# Patient Record
Sex: Male | Born: 1950 | Race: White | Hispanic: No | Marital: Married | Smoking: Former smoker
Health system: Southern US, Community
[De-identification: ages and names within clinical notes are randomized; demographics above are authoritative.]

## PROBLEM LIST (undated history)

## (undated) DIAGNOSIS — E119 Type 2 diabetes mellitus without complications: Secondary | ICD-10-CM

## (undated) DIAGNOSIS — J449 Chronic obstructive pulmonary disease, unspecified: Secondary | ICD-10-CM

## (undated) DIAGNOSIS — I10 Essential (primary) hypertension: Secondary | ICD-10-CM

## (undated) DIAGNOSIS — E7800 Pure hypercholesterolemia, unspecified: Secondary | ICD-10-CM

---

## 1998-09-30 ENCOUNTER — Ambulatory Visit: Admission: RE | Admit: 1998-09-30 | Payer: Self-pay | Source: Ambulatory Visit | Admitting: Urology

## 2003-06-26 ENCOUNTER — Emergency Department: Admit: 2003-06-26 | Payer: Self-pay | Source: Emergency Department | Admitting: Emergency Medicine

## 2005-03-27 DIAGNOSIS — J449 Chronic obstructive pulmonary disease, unspecified: Secondary | ICD-10-CM | POA: Diagnosis present

## 2005-03-27 DIAGNOSIS — G4733 Obstructive sleep apnea (adult) (pediatric): Secondary | ICD-10-CM | POA: Diagnosis present

## 2005-07-04 DIAGNOSIS — H905 Unspecified sensorineural hearing loss: Secondary | ICD-10-CM | POA: Diagnosis present

## 2008-09-19 ENCOUNTER — Emergency Department: Payer: Self-pay | Admitting: Emergency Medicine

## 2010-02-22 ENCOUNTER — Emergency Department: Payer: Self-pay | Admitting: Internal Medicine

## 2011-07-22 IMAGING — CT CT HEAD WITHOUT CONTRAST
2 series · 16 of 30 positions shown, 20 images · non-contrast
Comparison: none

REASON FOR EXAM: facial weakness
COMMENTS:

PROCEDURE:     CT  - CT HEAD WITHOUT CONTRAST  - February 22, 2010  [DATE]
RESULT:     Technique: Helical 5mm sections were obtained from the skull
base to the vertex without administration of intravenous contrast.

[Series 2: without · axial · non-contrast · 0.49mm/px · z∈[-173,-48]mm · 13 of 31 slices shown, 17 images]
[im 3/31  brain]
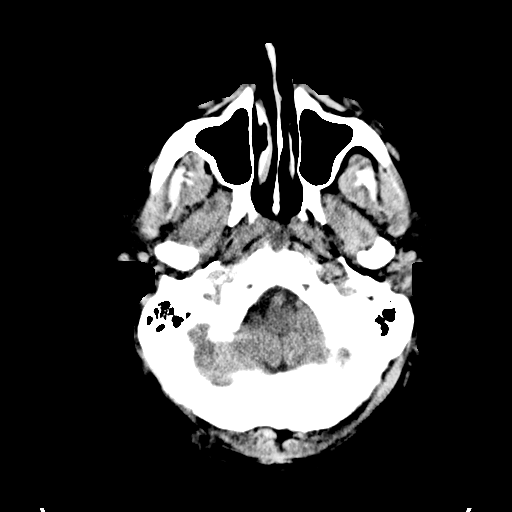
[im 3/31  bone]
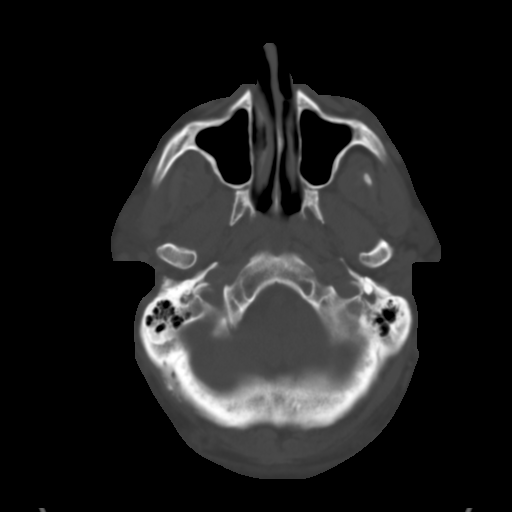
[im 5/31  brain]
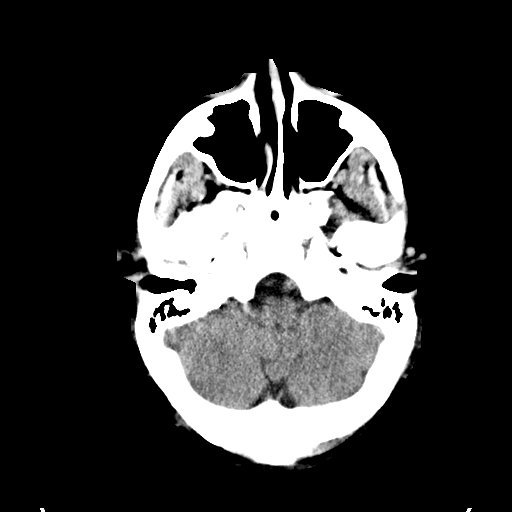
[im 7/31  brain]
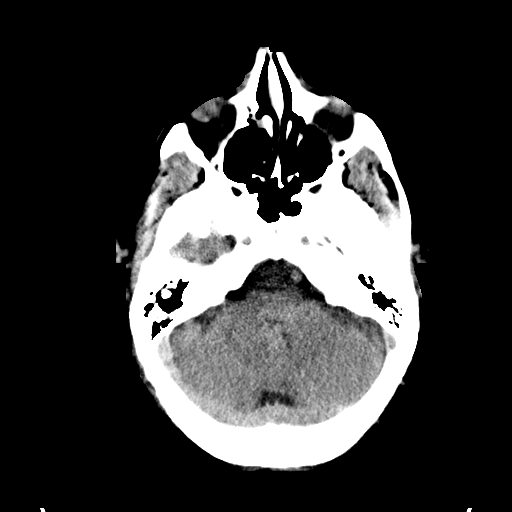
[im 9/31  brain]
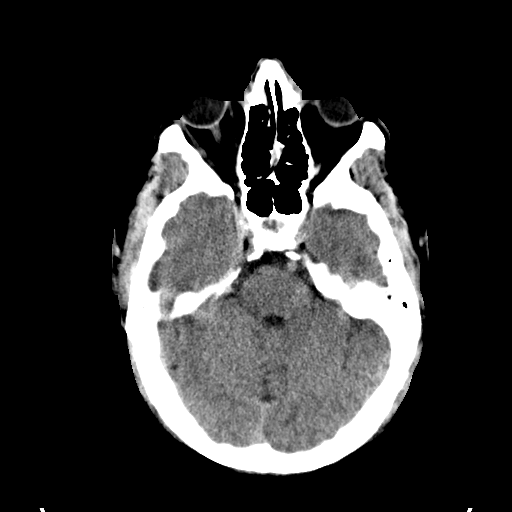
[im 11/31  brain]
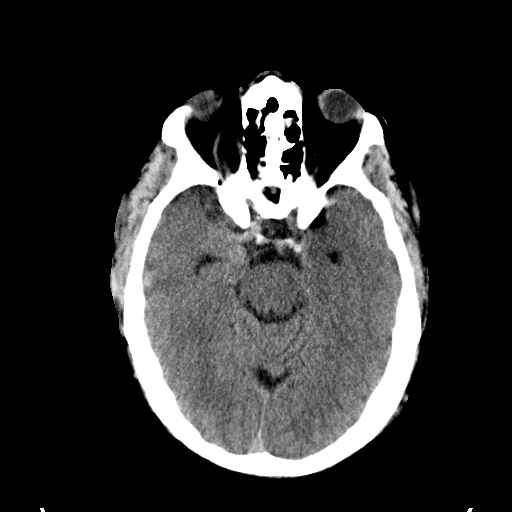
[im 11/31  bone]
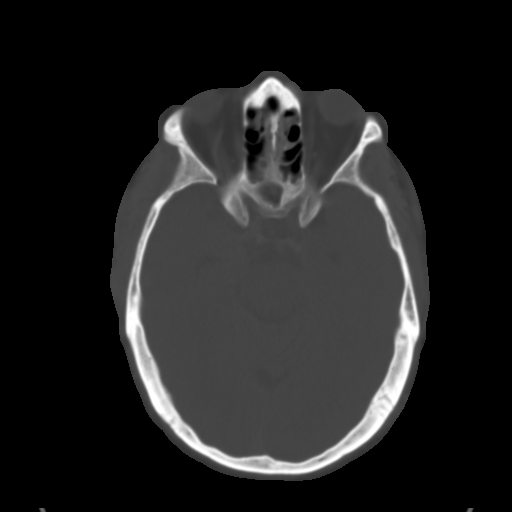
[im 13/31  brain]
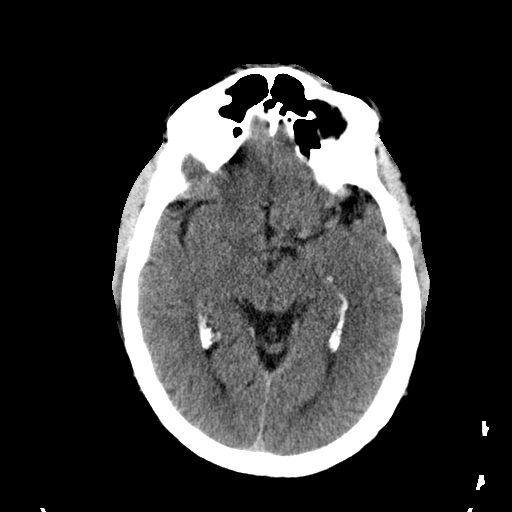
[im 16/31  brain]
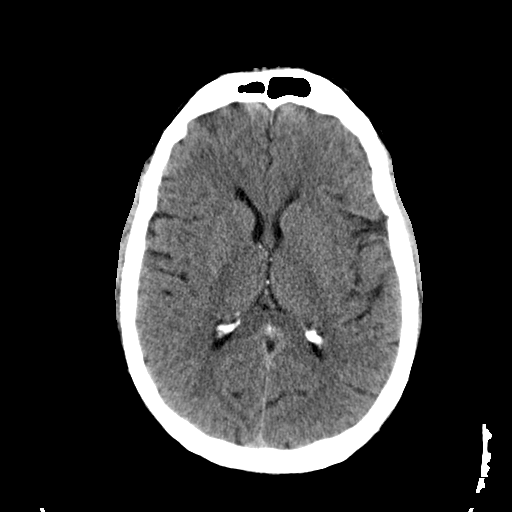
[im 18/31  brain]
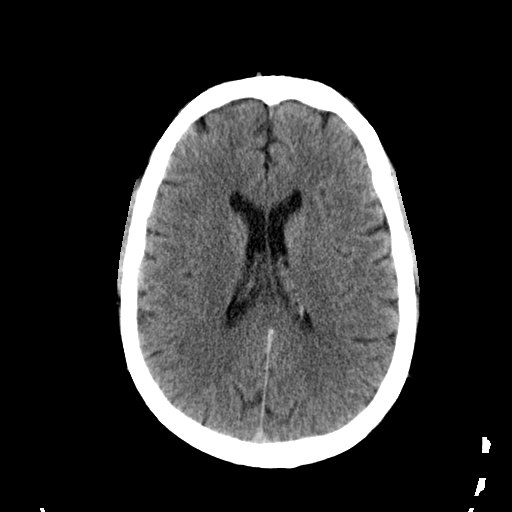
[im 20/31  brain]
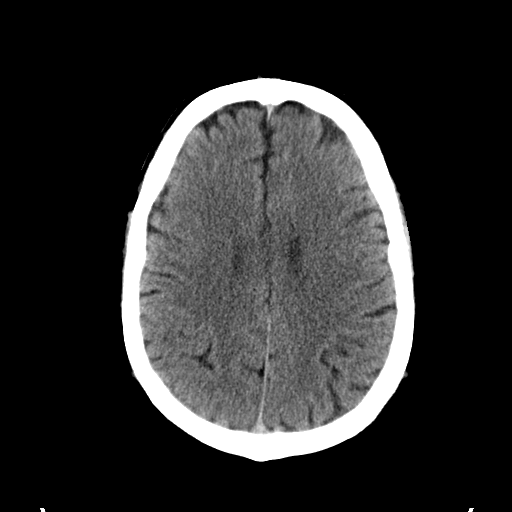
[im 20/31  bone]
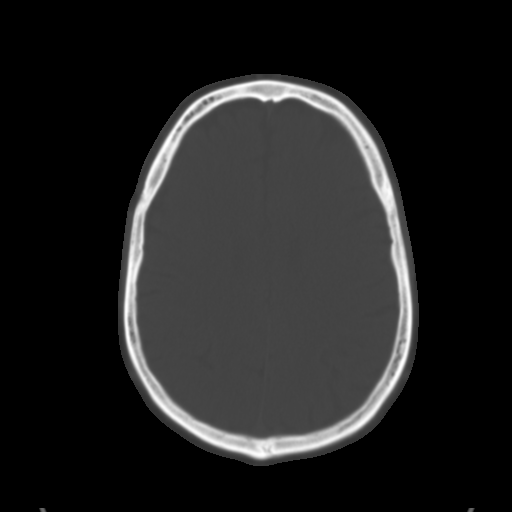
[im 22/31  brain]
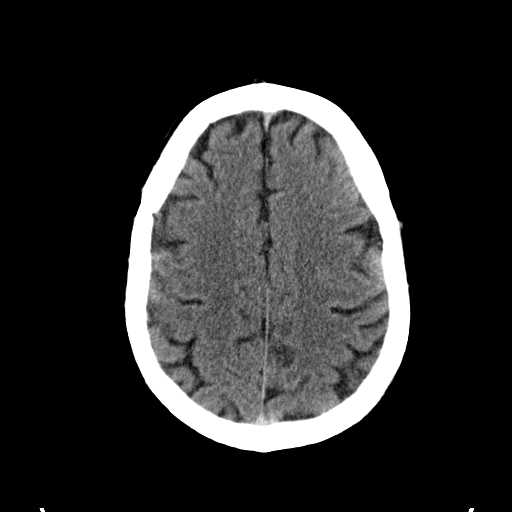
[im 24/31  brain]
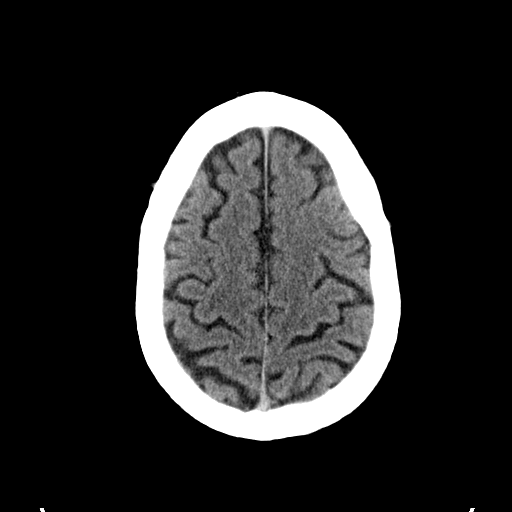
[im 26/31  brain]
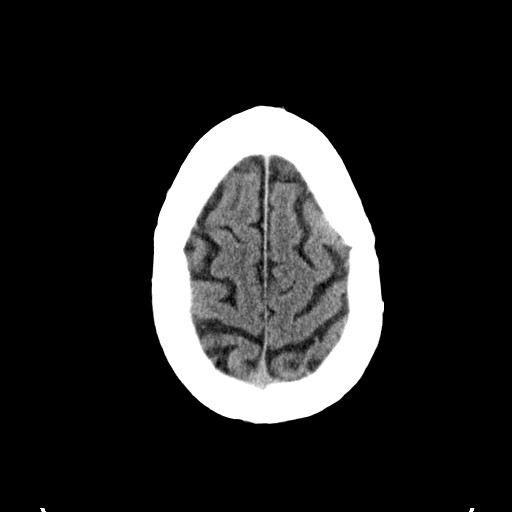
[im 28/31  brain]
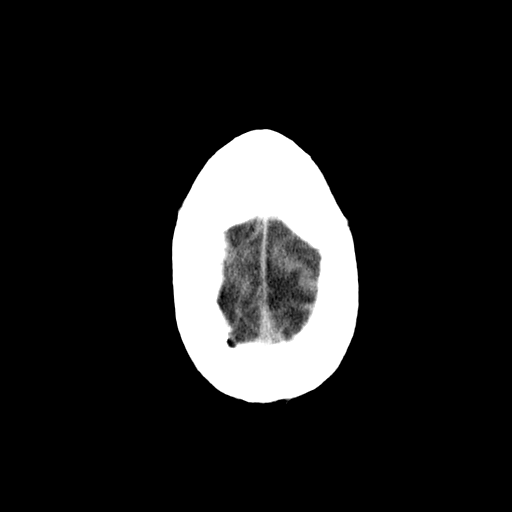
[im 28/31  bone]
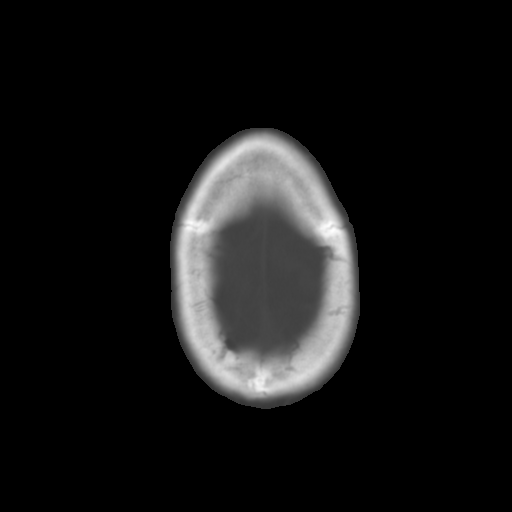

[Series 3: bone · axial · 0.49mm/px · z∈[-173,-133]mm · 3 of 31 slices shown]
[im 3/31  bone]
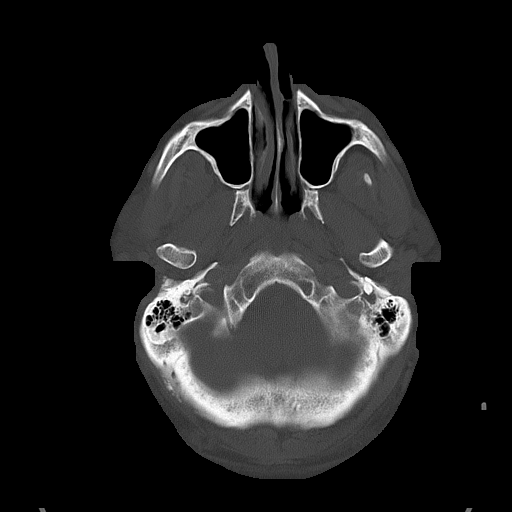
[im 7/31  bone]
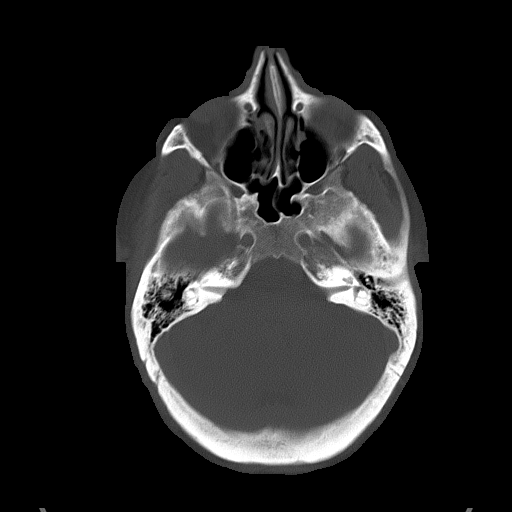
[im 11/31  bone]
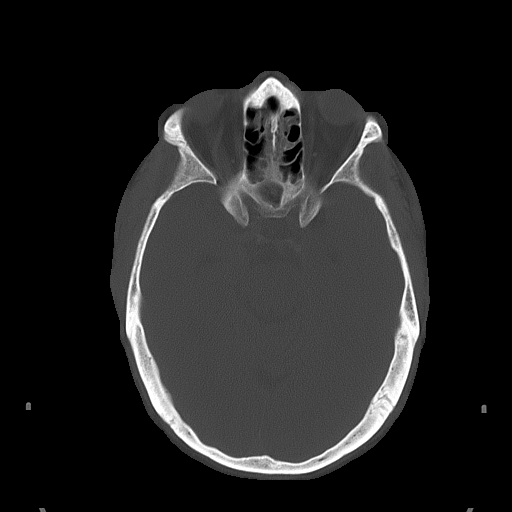

[16 of 30 positions shown; findings below may reference images not displayed]

FINDINGS: There is not evidence of intra-axial fluid collections. There is
no evidence of acute hemorrhage or secondary signs reflecting mass effect or
subacute or chronic focal territorial infarction. The osseous structures
demonstrate no evidence of a depressed skull fracture. If there is
persistent concern clinical follow-up with MRI is recommended.
IMPRESSION: 1. No evidence of acute intracranial abnormalitites.

## 2015-08-06 DIAGNOSIS — J9611 Chronic respiratory failure with hypoxia: Secondary | ICD-10-CM | POA: Insufficient documentation

## 2016-10-04 ENCOUNTER — Emergency Department
Admission: EM | Admit: 2016-10-04 | Discharge: 2016-10-04 | Disposition: A | Discharge status: Other Acute Inpatient Hospital | Payer: PRIVATE HEALTH INSURANCE | Attending: Emergency Medicine | Admitting: Emergency Medicine

## 2016-10-04 ENCOUNTER — Emergency Department: Payer: PRIVATE HEALTH INSURANCE

## 2016-10-04 DIAGNOSIS — J189 Pneumonia, unspecified organism: Secondary | ICD-10-CM | POA: Insufficient documentation

## 2016-10-04 HISTORY — DX: Type 2 diabetes mellitus without complications: E11.9

## 2016-10-04 HISTORY — DX: Essential (primary) hypertension: I10

## 2016-10-04 HISTORY — DX: Pure hypercholesterolemia, unspecified: E78.00

## 2016-10-04 HISTORY — DX: Chronic obstructive pulmonary disease, unspecified: J44.9

## 2016-10-04 LAB — CBC AND DIFFERENTIAL
Absolute NRBC: 0 10*3/uL
Basophils Absolute Automated: 0.04 10*3/uL (ref 0.00–0.20)
Basophils Automated: 0.4 %
Eosinophils Absolute Automated: 0.08 10*3/uL (ref 0.00–0.70)
Eosinophils Automated: 0.8 %
Hematocrit: 38.4 % — ABNORMAL LOW (ref 42.0–52.0)
Hgb: 11.6 g/dL — ABNORMAL LOW (ref 13.0–17.0)
Immature Granulocytes Absolute: 0.04 10*3/uL
Immature Granulocytes: 0.4 %
Lymphocytes Absolute Automated: 1.43 10*3/uL (ref 0.50–4.40)
Lymphocytes Automated: 14.4 %
MCH: 25.2 pg — ABNORMAL LOW (ref 28.0–32.0)
MCHC: 30.2 g/dL — ABNORMAL LOW (ref 32.0–36.0)
MCV: 83.5 fL (ref 80.0–100.0)
MPV: 10.5 fL (ref 9.4–12.3)
Monocytes Absolute Automated: 1.1 10*3/uL (ref 0.00–1.20)
Monocytes: 11.1 %
Neutrophils Absolute: 7.22 10*3/uL (ref 1.80–8.10)
Neutrophils: 72.9 %
Nucleated RBC: 0 /100 WBC (ref 0.0–1.0)
Platelets: 313 10*3/uL (ref 140–400)
RBC: 4.6 10*6/uL — ABNORMAL LOW (ref 4.70–6.00)
RDW: 17 % — ABNORMAL HIGH (ref 12–15)
WBC: 9.91 10*3/uL (ref 3.50–10.80)

## 2016-10-04 LAB — I-STAT CHEM 8 CARTRIDGE
BUN I-Stat: 15 mg/dL (ref 8–20)
Chloride I-Stat: 95 mEq/L — ABNORMAL LOW (ref 98–107)
Creatinine I-Stat: 1.5 mg/dL (ref 0.6–1.5)
Glucose I-Stat: 121 mg/dL — ABNORMAL HIGH (ref 70–100)
Hematocrit I-Stat: 57 % — ABNORMAL HIGH (ref 42.0–52.0)
Hemoglobin I-Stat: 19.4 g/dL — ABNORMAL HIGH (ref 13.0–17.0)
Potassium I-Stat: 2.9 mEq/L — ABNORMAL LOW (ref 3.5–5.3)
Sodium I-Stat: 140 mEq/L (ref 136–146)
i-STAT CO2: 32 mEq/L — ABNORMAL HIGH (ref 21–30)
i-STAT Calcium Ionized: 2.3 mEq/L (ref 2.30–2.58)

## 2016-10-04 LAB — I-STAT CG4 VENOUS CARTRIDGE
Lactic Acid I-Stat: 0.9 mmol/L (ref 0.2–2.0)
i-STAT Base Excess Venous: 7 mEq/L
i-STAT FIO2: 21
i-STAT HCO3 Bicarbonate Venous: 33.2 mEq/L
i-STAT Liters Per Minute: 4
i-STAT O2 Saturation Venous: 49 %
i-STAT Patient Temperature: 98.9
i-STAT Total CO2 Venous: 35 mEq/L
i-STAT pCO2 Venous: 54.2
i-STAT pH Venous: 7.396
i-STAT pO2 Venous: 28

## 2016-10-04 MED ORDER — POTASSIUM CHLORIDE CRYS ER 20 MEQ PO TBCR
20.0000 meq | EXTENDED_RELEASE_TABLET | Freq: Once | ORAL | Status: AC
Start: 2016-10-04 — End: 2016-10-04
  Administered 2016-10-04: 20 meq via ORAL
  Filled 2016-10-04: qty 1

## 2016-10-04 MED ORDER — ALBUTEROL SULFATE (2.5 MG/3ML) 0.083% IN NEBU
2.5000 mg | INHALATION_SOLUTION | Freq: Once | RESPIRATORY_TRACT | Status: AC
Start: 2016-10-04 — End: 2016-10-04
  Administered 2016-10-04: 2.5 mg via RESPIRATORY_TRACT
  Filled 2016-10-04: qty 3

## 2016-10-04 MED ORDER — SODIUM CHLORIDE 0.9 % IV BOLUS
500.0000 mL | Freq: Once | INTRAVENOUS | Status: AC
Start: 2016-10-04 — End: 2016-10-04
  Administered 2016-10-04: 500 mL via INTRAVENOUS

## 2016-10-04 MED ORDER — IPRATROPIUM BROMIDE 0.02 % IN SOLN
0.5000 mg | Freq: Once | RESPIRATORY_TRACT | Status: AC
Start: 2016-10-04 — End: 2016-10-04
  Administered 2016-10-04: 0.5 mg via RESPIRATORY_TRACT
  Filled 2016-10-04: qty 2.5

## 2016-10-04 MED ORDER — DOXYCYCLINE 100 MG MBP (CNR)
100.0000 mg | Freq: Once | INTRAVENOUS | Status: AC
Start: 2016-10-04 — End: 2016-10-04
  Administered 2016-10-04: 100 mg via INTRAVENOUS
  Filled 2016-10-04: qty 100

## 2016-10-04 NOTE — ED Notes (Signed)
Bed: S 22  Expected date:   Expected time:   Means of arrival:   Comments:  Medic 423

## 2016-10-04 NOTE — Discharge Instructions (Signed)
Dear Ralph Barron:    Thank you for choosing the [facility] Emergency Department, the premier emergency department in the [place] area.  I hope your visit today was EXCELLENT.    Specific instructions for your visit today:    Pneumonia    You have been diagnosed with pneumonia.    Pneumonia is an infection of the small air tubes and sacs of the lungs. It can be caused by a virus, bacteria or fungus.     Symptoms include fever (temperature higher than 100.50F / 38C), shortness of breath and a cough that produces a green or yellow material. You may have chest pain, vomiting, or headache.    Some people with pneumonia must stay in the hospital. Patients with certain risk factors, like diabetes, cancer, alcoholism, or another chronic medical condition, can be difficult to treat. The physician has determined that it is OK for you to go home.    Follow up closely with your doctor.    Do not smoke. Smoking may cause your pneumonia symptoms to become worse. Smoking also causes heart disease, cancer, and birth defects. If you smoke, ask your doctor for ideas about how to quit.   If you do not smoke, avoid others who do. Smoke will irritate your lungs and make your breathing worse while you have pneumonia.    YOU SHOULD SEEK MEDICAL ATTENTION IMMEDIATELY, EITHER HERE OR AT THE NEAREST EMERGENCY DEPARTMENT, IF ANY OF THE FOLLOWING OCCURS:   You wheeze or have trouble breathing.   You have a fever (temperature higher than 100.50F / 38C) that won't go away.   You cough up blood.   You have chest pain.   You vomit and cannot keep medications down or you feel dizzy, weak or confused.   You feel worse or don't improve in 2 to 3 days.   You are unable to perform your normal activities.                 If you do not continue to improve or your condition worsens, please contact your doctor or return immediately to the Emergency Department.    Sincerely,  Estrella Myrtle, MD  Attending Emergency Physician  [facility] Emergency Department    ONSITE PHARMACY  Our full service onsite pharmacy is located in the ER waiting room.  Open 7 days a week from 9 am to 11 pm.  We accept all major insurances and prices are competitive with major retailers.  Ask your provider to print your prescriptions down to the pharmacy to speed you on your way home.    OBTAINING A PRIMARY CARE APPOINTMENT    Primary care physicians (PCPs, also known as primary care doctors) are either internists or family medicine doctors. Both types of PCPs focus on health promotion, disease prevention, patient education and counseling, and treatment of acute and chronic medical conditions.    Call for an appointment with a primary care doctor.  Ask to see who is taking new patients.     [facility]  telephone:  [phone]  [email or web address]    DOCTOR REFERRALS  Call [phone] (available 24 hours a day, 7 days a week) if you need any further referrals and we can help you find a primary care doctor or specialist.  Also, available online at:  [email or web address]    YOUR CONTACT INFORMATION  Before leaving please check with registration to make sure we have an up-to-date contact number.  You can call  registration at [phone] to update your information.  For questions about your hospital bill, please call [phone].  For questions about your Emergency Dept Physician bill please call [phone].      [place]  If you need help with health or social services, please call 2-1-1 for a free referral to resources in your area.  2-1-1 is a free service connecting people with information on health insurance, free clinics, pregnancy, mental health, dental care, food assistance, housing, and substance abuse counseling.  Also, available online at:  http://www.[place].org    MEDICAL RECORDS AND TESTS  Certain laboratory test results do not come back the same day, for example urine cultures.   We will  contact you if other important findings are noted.  Radiology films are often reviewed again to ensure accuracy.  If there is any discrepancy, we will notify you.      Please call [phone] to pick up a complimentary CD of any radiology studies performed.  If you or your doctor would like to request a copy of your medical records, please call [phone].      ORTHOPEDIC INJURY   Please know that significant injuries can exist even when an initial x-ray is read as normal or negative.  This can occur because some fractures (broken bones) are not initially visible on x-rays.  For this reason, close outpatient follow-up with your primary care doctor or bone specialist (orthopedist) is required.    MEDICATIONS AND FOLLOWUP  Please be aware that some prescription medications can cause drowsiness.  Use caution when driving or operating machinery.    The examination and treatment you have received in our Emergency Department is provided on an emergency basis, and is not intended to be a substitute for your primary care physician.  It is important that your doctor checks you again and that you report any new or remaining problems at that time.      [place]  The nearest 24 hour pharmacy is:    [facility] [address] [place] [address]  [place]  [facility])  Call to start or finish an application, compare plans, enroll or ask a question.  [place]: [phone]  Web:  Healthcare.gov    Help Enrolling in [place]  289-397-4230 (TOLL-FREE)  [phone] (TTY)  Web:  Http://www.coverva.org    Local Help Enrolling in the [place]  [phone] (MAIN)  Email:  health-help@nvfs .org  Web:  http://lewis-perez.info/  Address:  [address], [address] [place], [place] [address]    SEDATING MEDICATIONS  Sedating medications include strong pain medications (e.g. narcotics),  muscle relaxers, benzodiazepines (used for anxiety and as muscle relaxers), Benadryl/diphenhydramine and other antihistamines for allergic reactions/itching, and other medications.  If you are unsure if you have received a sedating medication, please ask your physician or nurse.  If you received a sedating medication: DO NOT drive a car. DO NOT operate machinery. DO NOT perform jobs where you need to be alert.  DO NOT drink alcoholic beverages while taking this medicine.     If you get dizzy, sit or lie down at the first signs. Be careful going up and down stairs.  Be extra careful to prevent falls.     Never give this medicine to others.     Keep this medicine out of reach of  children.     Do not take or save old medicines. Throw them away when outdated.     Keep all medicines in a cool, dry place. DO NOT keep them in your bathroom medicine cabinet or in a cabinet above the stove.    MEDICATION REFILLS  Please be aware that we cannot refill any prescriptions through the ER. If you need further treatment from what is provided at your ER visit, please follow up with your primary care doctor or your pain management specialist.    [place]  Did you know [facility] has two freestanding ERs located just a few miles away?  [facility] [place] have short wait times, easy free parking directly in front of the building and top patient satisfaction scores - and the same Board Certified Emergency Medicine doctors as [facility].

## 2016-10-04 NOTE — ED Provider Notes (Signed)
[place] [facility] EMERGENCY DEPARTMENT H&P      Visit date: 10/04/2016      CLINICAL SUMMARY          Diagnosis:    .     Final diagnoses:   Pneumonia of both lungs due to infectious organism, unspecified part of lung         MDM Notes:      66 year old male pmh copd, htn, dm, hld presents with history of cough and congestion since Sunday.  He currently on day 3 of his azithromycin pack.  Reports worsening cough and congestion this morning.  Cough is productive with brown sputum.  His last nebulizer treatment was last night around 11 PM.  He is diffusely wheezy on exam with poor aeration, and sats in the 90s.  We will give DuoNeb here and reassess.  Chest x-ray to assess for pneumonia.    New Pna despite abx and worsening sats, will obs admit for further nebs and med mgmt.          Disposition:         Observation Admit      ED Disposition     ED Disposition Condition Date/Time Comment    Transfer to [facility]  Wed Oct 04, 2016  9:24 AM Dr Hart [place] accepting.                      CLINICAL INFORMATION        HPI:      Chief Complaint: Shortness of Breath  .    Ralph Barron is a 66 y.o. male h/o COPD who presents with cough, shortness of breath.  Cough is productive with brown sputum.  A/w nasal congestion.  Pt was dx with bronchitis on Sunday (3 days ago), rx'd azithromycin; pt did not have a CXR at that time.  Since then, pt's sxs have been worsening in severity.  Pt has been using albuterol nebulizers, without relief.  Pt is on supplemental O2 at night, and sometimes also during the day; today, he noted his O2 saturation to be low.  Denies hx of blood clots.  Pt notes having loose stools recently, but denies vomiting or fever.     History obtained from: Patient        ROS:      Review of Systems   Constitutional: Negative for fever.   HENT: Positive for congestion.    Respiratory: Positive for cough and shortness of breath.    Gastrointestinal: Positive for diarrhea.  Negative for vomiting.           Physical Exam:      Pulse (!) 112  BP 153/84  Resp (!) 24  SpO2 91 %  Temp 98.9 F (37.2 C)    Physical Exam   Constitutional: He is oriented to person, place, and time. He appears well-developed and well-nourished. No distress.   HENT:   Head: Normocephalic and atraumatic.   Eyes: Conjunctivae are normal. Pupils are equal, round, and reactive to light.   Neck: Neck supple. No tracheal deviation present.   Cardiovascular: Normal rate, normal heart sounds and intact distal pulses.    No murmur heard.  Pulmonary/Chest:   Poor aeration.  Decreased breath sounds in bilateral bases.  Wheezes in all fields.   Abdominal: Soft. Bowel sounds are normal. He exhibits no distension and no mass. There is no tenderness.   Musculoskeletal: He exhibits no edema or tenderness.   Neurological: He  is alert and oriented to person, place, and time.   Skin: Skin is warm and dry. He is not diaphoretic.   Psychiatric: He has a normal mood and affect.   Nursing note and vitals reviewed.               PAST HISTORY        Primary Care Provider: Pearletha Forge, MD        PMH/PSH:    .     Past Medical History:   Diagnosis Date   . Chronic obstructive pulmonary disease    . Diabetes mellitus    . High cholesterol    . Hypertension        He has no past surgical history on file.      Social/Family History:      He reports that he has never smoked. He does not have any smokeless tobacco history on file. He reports that he does not drink alcohol or use drugs.    History reviewed. No pertinent family history.      Listed Medications on Arrival:    .     Home Medications     None on File         Allergies: He has No Known Allergies.            VISIT INFORMATION        Clinical Course in the ED:          ED Course as of Oct 04 1545   Wed Oct 04, 2016   6885 66 year old male paraspinal history presents with history of cough and congestion since Sunday.  He currently on day 3 of his azithromycin pack.  Reports  worsening cough and congestion this morning.  Cough is productive with brown sputum.  His last nebulizer treatment was last night around 11 PM.  He is diffusely wheezy on exam with poor aeration, and sats in the 90s.  We will give DuoNeb here and reassess.  Chest x-ray to assess for pneumonia.  [JB]   0809 Sats improved and better aeration, will change meds to doxy and if  [JB]   0904 Pt sats dropped to low 80s after ambulation with 2 L NC  [JB]   0913 Dr. Suezanne Jacquet from [facility] to call back with admission bed. Requests cbc and cr labs.   [JB]      ED Course User Index  [JB] Estrella Myrtle, MD         Medications Given in the ED:    .     ED Medication Orders     Start Ordered     Status Ordering Provider    10/04/16 1030 10/04/16 0913  doxycycline (VIBRAMYCIN) 100 mg in sodium chloride 0.9 % 100 mL mini-bag plus  Once     Route: Intravenous  Ordered Dose: 100 mg     Last MAR action:  Given Shamond Skelton L    10/04/16 0936 10/04/16 0936  potassium chloride (K-DUR,KLOR-CON) CR tablet 20 mEq  Once     Route: Oral  Ordered Dose: 20 mEq     Last MAR action:  Given German Manke L    10/04/16 0757 10/04/16 0756  sodium chloride 0.9 % bolus 500 mL  Once     Route: Intravenous  Ordered Dose: 500 mL     Last MAR action:  Stopped Ethel Veronica L    10/04/16 0744 10/04/16 0743  albuterol (PROVENTIL) nebulizer solution 2.5 mg  RT -  Once     Route: Nebulization  Ordered Dose: 2.5 mg     Last MAR action:  Given Nygeria Lager L    10/04/16 0656 10/04/16 0655  albuterol (PROVENTIL) nebulizer solution 2.5 mg  RT - Once     Route: Nebulization  Ordered Dose: 2.5 mg     Last MAR action:  Given Kaiven Vester L    10/04/16 0656 10/04/16 0655  ipratropium (ATROVENT) 0.02 % nebulizer solution 0.5 mg  RT - Once     Route: Nebulization  Ordered Dose: 0.5 mg     Last MAR action:  Given Ruffin Lada L            Procedures:      Procedures      Interpretations:      O2 sat-           saturation: 91 %; Oxygen use: room air;  Interpretation: Hypoxic       EKG -             interpreted by me: Sinus tachycardia, no stemi, RBBB  Rate: 106  PR 192  QRS 148  QTC 488                      RESULTS        Lab Results:      Results     Procedure Component Value Units Date/Time    CBC with differential [[id number]]  (Abnormal) Collected:  10/04/16 0931    Specimen:  Blood from Blood Updated:  10/04/16 1019     WBC 9.91 x10 3/uL      Hgb 11.6 (L) g/dL      Hematocrit 60.1 (L) %      Platelets 313 x10 3/uL      RBC 4.60 (L) x10 6/uL      MCV 83.5 fL      MCH 25.2 (L) pg      MCHC 30.2 (L) g/dL      RDW 17 (H) %      MPV 10.5 fL      Neutrophils 72.9 %      Lymphocytes Automated 14.4 %      Monocytes 11.1 %      Eosinophils Automated 0.8 %      Basophils Automated 0.4 %      Immature Granulocyte 0.4 %      Nucleated RBC 0.0 /100 WBC      Neutrophils Absolute 7.22 x10 3/uL      Abs Lymph Automated 1.43 x10 3/uL      Abs Mono Automated 1.10 x10 3/uL      Abs Eos Automated 0.08 x10 3/uL      Absolute Baso Automated 0.04 x10 3/uL      Absolute Immature Granulocyte 0.04 x10 3/uL      Absolute NRBC 0.00 x10 3/uL     i-Stat Chem 8 CartrIDge [[id number]]  (Abnormal) Collected:  10/04/16 0933     Updated:  10/04/16 0942     i-STAT Glucose 121 (H) mg/dL      i-STAT BUN 15 mg/dL      i-STAT Creatinine 1.5 mg/dL      i-STAT Sodium 322 mEq/L      i-STAT Potassium 2.9 (L) mEq/L      i-STAT Chloride 95 (L) mEq/L      i-STAT CO2 32 (H) mEq/L      i-STAT Hematocrit 57.0 (H) %      i-STAT  Hemoglobin 19.4 (H) g/dL      i-STAT Calcium Ionized 2.30 mEq/L     i-Stat CG4 Venous CartrIDge [[id number]] Collected:  10/04/16 0631     Updated:  10/04/16 0637     i-STAT pH Venous 7.396     i-STAT pCO2 Venous 54.2     i-STAT pO2 Venous 28.0     i-STAT HCO3 Bicarbonate Venous 33.2 mEq/L      i-STAT Total CO2 Venous 35.0 mEq/L      i-STAT Base Excess Venous 7.0 mEq/L      i-STAT O2 Saturation Venous 49.0 %      i-STAT Lactic acid 0.9 mmol/L      i-STAT Patient Temperature 98.9     i-STAT  FIO2 21     i-STAT O2 Delivery Nasal Can     i-STAT Allen's Test NA     i-STAT Draw Site Venous     i-STAT Liters Per Minute 4.0              Radiology Results:      XR Chest 2 Views   Final Result      Opacities in the left lower lung and possibly in the mid right lung,   possibly pneumonia.      Follow-up after treatment to assess for resolution is suggested.      Johnsie Kindred, MD    10/04/2016 7:42 AM                  Scribe Attestation:      I was acting as a Neurosurgeon for Estrella Myrtle, MD on Edyth Gunnels Barron  Treatment Team: Scribe: April Manson     I am the first provider for this patient and I personally performed the services documented. Treatment Team: Scribe: April Manson is scribing for me on Scheerer,Ralph Barron. This note and the patient instructions accurately reflect work and decisions made by me.  Estrella Myrtle, MD            Estrella Myrtle, MD  10/04/16 [phone]

## 2016-10-04 NOTE — ED Notes (Signed)
[id number] bed #1 phone:[phone]

## 2016-10-05 LAB — ECG 12-LEAD
Atrial Rate: 106 {beats}/min
P Axis: 53 degrees
P-R Interval: 192 ms
Q-T Interval: 368 ms
QRS Duration: 148 ms
QTC Calculation (Bezet): 488 ms
R Axis: 14 degrees
T Axis: 5 degrees
Ventricular Rate: 106 {beats}/min

## 2019-10-15 ENCOUNTER — Emergency Department: Payer: PRIVATE HEALTH INSURANCE

## 2019-10-15 ENCOUNTER — Emergency Department
Admission: EM | Admit: 2019-10-15 | Discharge: 2019-10-15 | Disposition: A | Discharge status: Home / Self Care | Payer: PRIVATE HEALTH INSURANCE | Attending: Emergency Medicine | Admitting: Emergency Medicine

## 2019-10-15 DIAGNOSIS — E7800 Pure hypercholesterolemia, unspecified: Secondary | ICD-10-CM | POA: Insufficient documentation

## 2019-10-15 DIAGNOSIS — I6521 Occlusion and stenosis of right carotid artery: Secondary | ICD-10-CM | POA: Insufficient documentation

## 2019-10-15 DIAGNOSIS — Z9981 Dependence on supplemental oxygen: Secondary | ICD-10-CM | POA: Insufficient documentation

## 2019-10-15 DIAGNOSIS — J449 Chronic obstructive pulmonary disease, unspecified: Secondary | ICD-10-CM | POA: Insufficient documentation

## 2019-10-15 DIAGNOSIS — R200 Anesthesia of skin: Secondary | ICD-10-CM | POA: Insufficient documentation

## 2019-10-15 DIAGNOSIS — Z7984 Long term (current) use of oral hypoglycemic drugs: Secondary | ICD-10-CM | POA: Insufficient documentation

## 2019-10-15 DIAGNOSIS — Z7951 Long term (current) use of inhaled steroids: Secondary | ICD-10-CM | POA: Insufficient documentation

## 2019-10-15 DIAGNOSIS — R531 Weakness: Secondary | ICD-10-CM

## 2019-10-15 DIAGNOSIS — G510 Bell's palsy: Secondary | ICD-10-CM

## 2019-10-15 DIAGNOSIS — I10 Essential (primary) hypertension: Secondary | ICD-10-CM | POA: Insufficient documentation

## 2019-10-15 DIAGNOSIS — R42 Dizziness and giddiness: Secondary | ICD-10-CM

## 2019-10-15 DIAGNOSIS — E119 Type 2 diabetes mellitus without complications: Secondary | ICD-10-CM | POA: Insufficient documentation

## 2019-10-15 LAB — COMPREHENSIVE METABOLIC PANEL
ALT: 26 U/L (ref 0–55)
AST (SGOT): 26 U/L (ref 5–34)
Albumin/Globulin Ratio: 1.5 (ref 0.9–2.2)
Albumin: 4.2 g/dL (ref 3.5–5.0)
Alkaline Phosphatase: 69 U/L (ref 38–106)
Anion Gap: 15 (ref 5.0–15.0)
BUN: 15 mg/dL (ref 9.0–28.0)
Bilirubin, Total: 0.5 mg/dL (ref 0.2–1.2)
CO2: 26 mEq/L (ref 22–29)
Calcium: 9.1 mg/dL (ref 7.9–10.2)
Chloride: 99 mEq/L — ABNORMAL LOW (ref 100–111)
Creatinine: 1.2 mg/dL (ref 0.7–1.3)
Globulin: 2.8 g/dL (ref 2.0–3.6)
Glucose: 143 mg/dL — ABNORMAL HIGH (ref 70–100)
Potassium: 3.4 mEq/L — ABNORMAL LOW (ref 3.5–5.1)
Protein, Total: 7 g/dL (ref 6.0–8.3)
Sodium: 140 mEq/L (ref 136–145)

## 2019-10-15 LAB — CBC AND DIFFERENTIAL
Absolute NRBC: 0 10*3/uL (ref 0.00–0.00)
Basophils Absolute Automated: 0.07 10*3/uL (ref 0.00–0.08)
Basophils Automated: 0.6 %
Eosinophils Absolute Automated: 0.23 10*3/uL (ref 0.00–0.44)
Eosinophils Automated: 2 %
Hematocrit: 45.4 % (ref 37.6–49.6)
Hgb: 15.2 g/dL (ref 12.5–17.1)
Immature Granulocytes Absolute: 0.1 10*3/uL — ABNORMAL HIGH (ref 0.00–0.07)
Immature Granulocytes: 0.8 %
Lymphocytes Absolute Automated: 1.81 10*3/uL (ref 0.42–3.22)
Lymphocytes Automated: 15.4 %
MCH: 29.8 pg (ref 25.1–33.5)
MCHC: 33.5 g/dL (ref 31.5–35.8)
MCV: 89 fL (ref 78.0–96.0)
MPV: 10.2 fL (ref 8.9–12.5)
Monocytes Absolute Automated: 0.68 10*3/uL (ref 0.21–0.85)
Monocytes: 5.8 %
Neutrophils Absolute: 8.88 10*3/uL — ABNORMAL HIGH (ref 1.10–6.33)
Neutrophils: 75.4 %
Nucleated RBC: 0 /100 WBC (ref 0.0–0.0)
Platelets: 232 10*3/uL (ref 142–346)
RBC: 5.1 10*6/uL (ref 4.20–5.90)
RDW: 13 % (ref 11–15)
WBC: 11.77 10*3/uL — ABNORMAL HIGH (ref 3.10–9.50)

## 2019-10-15 LAB — ECG 12-LEAD
Atrial Rate: 100 {beats}/min
Atrial Rate: 101 {beats}/min
Q-T Interval: 422 ms
Q-T Interval: 434 ms
QRS Duration: 166 ms
QRS Duration: 168 ms
QTC Calculation (Bezet): 497 ms
QTC Calculation (Bezet): 530 ms
R Axis: -55 degrees
R Axis: -58 degrees
T Axis: 20 degrees
T Axis: 23 degrees
Ventricular Rate: 79 {beats}/min
Ventricular Rate: 95 {beats}/min

## 2019-10-15 LAB — GFR: EGFR: 59.7

## 2019-10-15 LAB — GLUCOSE WHOLE BLOOD - POCT: Whole Blood Glucose POCT: 159 mg/dL — ABNORMAL HIGH (ref 70–100)

## 2019-10-15 LAB — PT/INR
PT INR: 1.1 (ref 0.9–1.1)
PT: 13.7 s (ref 12.6–15.0)

## 2019-10-15 LAB — APTT: PTT: 37 s (ref 23–37)

## 2019-10-15 LAB — TYPE AND SCREEN
AB Screen Gel: NEGATIVE
ABO Rh: O POS

## 2019-10-15 LAB — TROPONIN I: Troponin I: 0.02 ng/mL (ref 0.00–0.05)

## 2019-10-15 MED ORDER — ALTEPLASE 100 MG IV SOLR
INTRAVENOUS | Status: DC
Start: 2019-10-15 — End: 2019-10-15
  Filled 2019-10-15: qty 100

## 2019-10-15 MED ORDER — PREDNISONE 20 MG PO TABS
60.00 mg | ORAL_TABLET | Freq: Every day | ORAL | 0 refills | Status: AC
Start: 2019-10-15 — End: 2019-10-22

## 2019-10-15 MED ORDER — PREDNISONE 20 MG PO TABS
60.00 mg | ORAL_TABLET | Freq: Once | ORAL | Status: AC
Start: 2019-10-15 — End: 2019-10-15
  Administered 2019-10-15: 23:00:00 60 mg via ORAL
  Filled 2019-10-15: qty 3

## 2019-10-15 MED ORDER — LABETALOL HCL 5 MG/ML IV SOLN (WRAP)
INTRAVENOUS | Status: DC
Start: 2019-10-15 — End: 2019-10-16
  Filled 2019-10-15: qty 4

## 2019-10-15 MED ORDER — IOHEXOL 350 MG/ML IV SOLN
100.00 mL | Freq: Once | INTRAVENOUS | Status: AC | PRN
Start: 2019-10-15 — End: 2019-10-15
  Administered 2019-10-15: 100 mL via INTRAVENOUS

## 2019-10-15 MED ORDER — NICARDIPINE 25 MG/100 ML IVPB (SIMPLE)
INTRAVENOUS | Status: DC
Start: 2019-10-15 — End: 2019-10-15
  Filled 2019-10-15: qty 100

## 2019-10-15 MED ORDER — LABETALOL HCL 5 MG/ML IV SOLN (WRAP)
10.00 mg | Freq: Once | INTRAVENOUS | Status: AC
Start: 2019-10-15 — End: 2019-10-15
  Administered 2019-10-15: 21:00:00 10 mg via INTRAVENOUS

## 2019-10-15 NOTE — Discharge Instructions (Signed)
Bells Palsy    You have been diagnosed with Bell's Palsy.    Bell's palsy is when the facial nerve that allows the face to move is affected. This makes the facial muscles not work properly. It is thought to sometimes be caused by a virus. This virus comes from the Herpes family of viruses. The nerve that supplies movement to one side of the face is paralyzed.    Bell's Palsy causes weakness of both the upper and lower parts of the face. This makes it different than the symptoms of a stroke. With a stroke, only the lower part of the face is paralyzed. Bell's Palsy affects muscles that close the eye. You may have trouble raising your eyebrow or wrinkling your forehead. It also affects muscles that allow you to smile. Bell's Palsy is on only one side of the face. This is either the left half or the right. It never happens to both at the same time.    You were given a prescription for medicine to help with your symptoms. Most patients' symptoms will improve. However, the paralysis can continue for some patients. However, this is rare.    Make sure to use the eye drops. These will keep your eye from drying out. You can buy viscous eye drops or ointment. You can find them at the store. Use them as needed.    Follow up with your doctor or referral neurologist in the next few days.    YOU SHOULD SEEK MEDICAL ATTENTION IMMEDIATELY, EITHER HERE OR AT THE NEAREST EMERGENCY DEPARTMENT, IF ANY OF THE FOLLOWING OCCURS:   Severe headache.   Worsening symptoms.   Eye pain, eye drainage or vision problems.        Dear Mr. Ralph Barron:    Thank you for choosing one of [facility] emergency departments.  I hope your visit today was EXCELLENT.    Specific instructions for your visit today:      IF YOU DO NOT CONTINUE TO IMPROVE OR YOUR CONDITION WORSENS, PLEASE CONTACT YOUR DOCTOR OR RETURN IMMEDIATELY TO THE EMERGENCY DEPARTMENT.    Sincerely,  Virl Cagey, MD  Attending Emergency Physician  [facility] Emergency Department    OBTAINING A PRIMARY CARE APPOINTMENT    Primary care physicians (PCPs, also known as primary care doctors) are either internists or family medicine doctors. Both types of PCPs focus on health promotion, disease prevention, patient education and counseling, and treatment of acute and chronic medical conditions.    Call for an appointment with a primary care doctor.  Ask to see who is taking new patients.     [facility]  telephone:  [phone]  [email or web address]    For a pediatrician, call the [facility] referral line below.  You can also call to make an appointment at [facility] (except [facility] and [facility]):    [address]  [place], [place] [address]  [phone]    Valentina Lucks  Call [phone] (available 24 hours a day, 7 days a week) if you need any further referrals and we can help you find a primary care doctor or specialist.  Also, available online at:  [email or web address]    For more information regarding our services at [facility], please call the number above or visit the website [email or web address]    YOUR CONTACT INFORMATION  Before leaving please check with registration to make sure we have an up-to-date contact number.  You  can call registration at [phone], Option 7 [facility] location) or [phone], Option 1 [facility] location) to update your information.  For questions about your hospital bill, please call [phone].  For questions about your Emergency Dept Physician bill please call [phone].      FREE HEALTH SERVICES  If you need help with health or social services, please call 2-1-1 for a free referral to resources in your area.  2-1-1 is a free service connecting people with information on health insurance, free clinics, pregnancy, mental health, dental care, food assistance, housing, and substance abuse counseling.  Also, available  online at:  http://www.[facility].org    MEDICAL RECORDS AND TESTS  Certain laboratory test results do not come back the same day, for example urine cultures.   We will contact you if other important findings are noted.  Radiology films are often reviewed again to ensure accuracy.  If there is any discrepancy, we will notify you.      Please call [phone] [facility] location) or [phone] ([place] location) to pick up a complimentary CD of any radiology studies performed.  If you or your doctor would like to request a copy of your medical records, please call [phone].      ORTHOPEDIC INJURY   Please know that significant injuries can exist even when an initial x-ray is read as normal or negative.  This can occur because some fractures (broken bones) are not initially visible on x-rays.  For this reason, close outpatient follow-up with your primary care doctor or bone specialist (orthopedist) is required.    MEDICATIONS AND FOLLOWUP  Please be aware that some prescription medications can cause drowsiness.  Use caution when driving or operating machinery.    The examination and treatment you have received in our Emergency Department is provided on an emergency basis, and is not intended to be a substitute for your primary care physician.  It is important that your doctor checks you again and that you report any new or remaining problems at that time.      24 HOUR PHARMACIES  Two nearby 24 hour pharmacies are:    [facility]  [address]  [place], [place] [address]  [phone]    [facility]  [address]  [place], [place] [address]  [phone]      ASSISTANCE WITH INSURANCE    Affordable Care Act  [facility])  Call to start or finish an application, compare plans, enroll or ask a question.  [phone]  TTY: [phone]  Web:  Healthcare.gov    Help Enrolling in [facility]  [employer] [place]  425-123-1542 (TOLL-FREE)  915-264-8958 (TTY)  Web:  Http://www.coverva.org    Local Help  Enrolling in the [facility]  [place] [facility]  215-450-8275 (MAIN)  Email:  health-help@nvfs .org  Web:  BlackjackMyths.is  Address:  [address], [address] [place], [place] [address]    SEDATING MEDICATIONS  Sedating medications include strong pain medications (e.g. narcotics), muscle relaxers, benzodiazepines (used for anxiety and as muscle relaxers), Benadryl/diphenhydramine and other antihistamines for allergic reactions/itching, and other medications.  If you are unsure if you have received a sedating medication, please ask your physician or nurse.  If you received a sedating medication: DO NOT drive a car. DO NOT operate machinery. DO NOT perform jobs where you need to be alert.  DO NOT drink alcoholic beverages while taking this medicine.     If you get dizzy, sit or lie down at the first signs. Be  careful going up and down stairs.  Be extra careful to prevent falls.     Never give this medicine to others.     Keep this medicine out of reach of children.     Do not take or save old medicines. Throw them away when outdated.     Keep all medicines in a cool, dry place. DO NOT keep them in your bathroom medicine cabinet or in a cabinet above the stove.    MEDICATION REFILLS  Please be aware that we cannot refill any prescriptions through the ER. If you need further treatment from what is provided at your ER visit, please follow up with your primary care doctor or your pain management specialist.

## 2019-10-15 NOTE — Consults (Signed)
STROKE ALERT NOTE    Date/Time: 10/15/19 8:21 PM  Patient Name: Ralph Barron  Attending Physician: Virl Cagey, MD    Assessment:   Wally Shean is a 69 year old male with a PMHx of HLD, HTN (med controlled), Bell's Palsy (R sided, 2013), T2DM and COPD on home O2 who presents ambulatory to the ED on 10/15/19 with left facial droop and sensory deficit. LNW was 1915. NIHSS 4, pertinent exam findings of L lower face paralysis, dysarthria, and sensory deficit.     NCHCT: No acute hemorrhage or obvious hypodensities.  CTA H&N: No LVO or flow limiting stenosis  MRI Brain: No diffusion abnormalities.     Patient was not offered tPA 2/2 no abnormalities on MRI. Not a candidate for MET as no LVO.    1. Right facial droop  - Given unremarkable neuroimaging, highly unlikely to be acute CVA. Possibly Bell's Palsy complicated by associated hypertensive urgency (SBP 210s on arrival). It is concerning that patient has associated facial sensory deficits, not involving the more typical lingual sensory deficit a/w Bells, and further work up for infectious or autoimmune etiologies may be warranted especially if symptoms worsen or do not improve with standard Bell's Palsy treatment.     Plan:   - Defer to [facility] for further management, admission versus outpatient neurology follow up.  - Defer to ED for hypertension management, BP responded well to IV Labetalol, however neuro deficits did not improve.   - CBC, CMP, trop, EKG.   - No further stroke imaging/work up is warranted at this time.   - Normothermia, normotension, euglycemia.  - Bedside swallow eval to be completed by nursing, may need SLP eval.    History of Presenting Illness:   Ralph Barron is a 69 year old male with a PMHx of HLD, HTN (med controlled), Bell's Palsy (R sided, 2013), T2DM and COPD on home O2 who presents ambulatory to the ED on 10/15/19 with left facial droop and sensory deficit. The patient reports that he was eating dinner when at [place], he felt  suddenly that he was having difficulty keeping the food in his mouth. It was at that time that his wife noted that he had a left sided facial droop. Almost immediately, he developed associated sensory deficit to left lower face and noted that he had difficulty swallowing his saliva. He denies any motor sensory deficits, headache, vision loss, blurred vision, dizziness, nausea, vomiting, chest pain, cough, or shortness of breath. He has a history of bell's palsy on the right side approximately 8 years ago, but states that this feels different as he did not have associated numbness and had significant forehead/eye involvement with previous presentation. He is high functioning despite home O2 use, completes all his ADLS, and has not had any recent illness except for RLE cellulitis several months ago which was treated and resolved.       Presentation Data:     Presentation Data - 10/15/19 2020     Stroke Alert Activation (Date):  10/15/19     Stroke Team Response (Date):  10/15/19     Discovery of Stroke Symptoms (Time):  --     Discovery of Stroke Symptoms (Date):  --     Last Known Well - Time  1915     Last Known Well - Date  10/15/19     mRS Assessment Source  Patient     mRS Value  0 - No symptoms     Patient Arrival  Time (ED only)  --     Time of Discovery (inpatient only)  --     Initial Blood Glucose < 50 or >400  No         Initial Vital Signs:  BP:(!) 217/106 (10/15/19 2003)  Pulse:(!) 115 (10/15/19 2001)  Resp:22 (10/15/19 2003)  Temp:98.3 F (36.8 C) (10/15/19 2119)  SpO2:96 % (10/15/19 2001)    NIHSS Stroke Scale:     NIHSS - 10/15/19 2019     Interval  Baseline admission to ED     Level of Consciousness (1a. )  0     Question - age, month  0     Commands; Open and close eyes; Grip and release good hand  0     Gaze  0     Visual Fields  0     Facial Palsy  2     Motor Left Arm:  Arms (palm down) x 10 seconds sitting = 90 degrees supine = 45 degress  0     Motor Right Arm:  Arms (palm down) x 10 seconds  sitting = 90 degrees supine = 45 degress  0     Total Motor Arms  0     Motor Left Leg x 5 seconds supine = 30 degrees  0     Motor Right  Leg x 5 seconds supine = 30 degrees  0     Total Motor Legs  0     Limb Ataxia finger to nose  0     Sensory to pin prick  1     Best language describe picture, name items  0     Dysarthria reads words from list  1     Extinction and Inattention  0     NIHSS Total  4         CT:     CT Wet Reads    No documentation.       IV Alteplase Administration:     IV thrombolytic Administration    No documentation.       Recent tPA Administrations (last 72 hours)     None        Thrombectomy:     Thrombectomy    No documentation.       Allergies:   No Known Allergies    Medications:     (Not in a hospital admission)    Current Facility-Administered Medications   Medication Dose Route Frequency    labetalol           Past Medical History:     Past Medical History:   Diagnosis Date    Chronic obstructive pulmonary disease     Diabetes mellitus     High cholesterol     Hypertension        Past Surgical History:   No past surgical history on file.    Family History:   No family history on file.    Social History:     Social History     Socioeconomic History    Marital status: Married     Spouse name: Not on file    Number of children: Not on file    Years of education: Not on file    Highest education level: Not on file   Occupational History    Not on file   Social Needs    Financial resource strain: Not on file    Food insecurity     Worry: Not  on file     Inability: Not on file    Transportation needs     Medical: Not on file     Non-medical: Not on file   Tobacco Use    Smoking status: Never Smoker   Substance and Sexual Activity    Alcohol use: No    Drug use: No    Sexual activity: Not on file   Lifestyle    Physical activity     Days per week: Not on file     Minutes per session: Not on file    Stress: Not on file   Relationships    Social connections     Talks on  phone: Not on file     Gets together: Not on file     Attends religious service: Not on file     Active member of club or organization: Not on file     Attends meetings of clubs or organizations: Not on file     Relationship status: Not on file    Intimate partner violence     Fear of current or ex partner: Not on file     Emotionally abused: Not on file     Physically abused: Not on file     Forced sexual activity: Not on file   Other Topics Concern    Not on file   Social History Narrative    Not on file       Review of Systems:   Pertinent items are noted in HPI.      Physical Exam:   Vital Signs:  Reviewed    General: Well developed and well nourished. No acute distress. Cooperative with the exam  ENT: Normal oral mucosa, no ear or nose discharge  Skin: Intact, extremities normal in color  Psych: Affect is normal, good insight    Mental Status: The patient is awake, alert and oriented to person, place, and time.  Affect is normal  Fund of knowledge appropriate  Recent and remote memory are intact   Attention span and concentration appear normal.  Language function is normal. There is no evidence of aphasia in conversational speech.    Cranial nerves:   -CN II: Visual fields full to bedside confrontation   -CN Barron, IV, VI: Pupils equal, round, and reactive to light; extraocular movements intact; no ptosis.                -CN V: Facial sensation intact in V1 through V3 distributions   -CN VII: Left lower facial paralysis. Able to close left eye completely but slight difference in strength on grimace compared to right.   -CN VIII: Hearing intact to conversational speech   -CN IX, X: Palate elevates symmetrically; normal phonation   -CN XI: Symmetric full strength of sternocleidomastoid and trapezius muscles   -CN XII: Tongue protrudes midline    Motor: Muscle tone normal without spasticity or flaccidity. No atrophy.  No pronator drift.  Strength          R / L                                         R /  L  Deltoid             5 / 5                 Hip Flexion  5 / 5  Triceps            5 / 5                 Hip extension  5 / 5  Biceps             5 / 5                 Knee flexion    5 / 5  Wrist ext          5 / 5                 Knee ext         5 / 5  Wrist flexion    5 / 5                 Dorsiflexion     5 / 5  FF                   5 / 5                 Plantar flexion 5 / 5    Sensory:   Light touch intact x 4 extremities.  Left lower face 70% sensory loss compared to right lower face.    Coordination: FTN and HKS intact, no truncal ataxia. RAMs intact. No tremors    Gait: Deferred    Signed by: Lucretia Kern, FNP   Stroke Neurology NP  [phone]

## 2019-10-15 NOTE — ED Notes (Signed)
Pt transported to MRI with RN, ER Pharmacist Aundra Millet, Neuro NP Darden Amber. Pt is on cardiac monitor at this time.

## 2019-10-15 NOTE — ED Notes (Signed)
Stroke NP Fleet Contras at bedside

## 2019-10-15 NOTE — ED Provider Notes (Addendum)
[facility] EMERGENCY DEPARTMENT H&P  Visit date: 10/15/2019       CLINICAL INFORMATION        HPI:      Chief Complaint: Numbness and Facial Droop  .    Ralph Barron is a 69 y.o. male with a history of  has a past medical history of Chronic obstructive pulmonary disease, Diabetes mellitus, High cholesterol, and Hypertension.    who presents with L sided facial numbness, facial droop.  States onset of symptoms 1915.  Denies any arm or leg weakness.  Denies any headache, nausea, vomiting.  Denies vision changes.  Per report; he was with family and they noticed some lip smacking and facial asymmetry.  Family called [facility] and were directed to call 911.    History obtained from: Patient    Primary care physician: Liston Alba, MD           ROS:      Review of Systems   Unable to perform ROS: Acuity of condition   Eyes: Negative for visual disturbance.   Gastrointestinal: Negative for vomiting.   Neurological: Negative for headaches.         Physical Exam:      Pulse (!) 115   BP (!) 217/106   Resp 22   SpO2 96 %   Temp 98.3 F (36.8 C)    Physical Exam  HENT:      Head: Normocephalic and atraumatic.      Nose: Nose normal.      Mouth/Throat:      Mouth: Mucous membranes are moist.   Eyes:      Pupils: Pupils are equal, round, and reactive to light.   Neck:      Musculoskeletal: Normal range of motion and neck supple.   Cardiovascular:      Rate and Rhythm: Normal rate.      Pulses: Normal pulses.   Pulmonary:      Effort: Pulmonary effort is normal.   Abdominal:      General: Abdomen is flat.      Palpations: Abdomen is soft.   Musculoskeletal: Normal range of motion.         General: No swelling.   Skin:     General: Skin is warm.      Capillary Refill: Capillary refill takes less than 2 seconds.   Neurological:      Mental Status: He is alert.      Comments: L sided facial droop, forehead sparing.  L sided lower facial numbness                      PAST HISTORY                 PMH/PSH:    .     Past Medical History:   Diagnosis Date    Chronic obstructive pulmonary disease     Diabetes mellitus     High cholesterol     Hypertension        He has no past surgical history on file.      Social/Family History:      He reports that he has never smoked. He does not have any smokeless tobacco history on file. He reports that he does not drink alcohol or use drugs.    History reviewed. No pertinent family history.      Listed Medications on Arrival:    .     Home Medications  Med List Status: In Progress Set By: Lyndal Pulley, RN at 10/15/2019 2311                Advair Diskus 500-50 MCG/DOSE Aerosol Pwdr, Breath Activated          albuterol-ipratropium (DUO-NEB) 2.5-0.5(3) mg/3 mL nebulizer          amLODIPine (NORVASC) 10 MG tablet          azelastine (ASTELIN) 0.1 % nasal spray          glipiZIDE (GLUCOTROL) 5 MG tablet          ipratropium (ATROVENT) 0.03 % nasal spray          lisinopril (ZESTRIL) 40 MG tablet          metFORMIN (GLUCOPHAGE) 850 MG tablet          montelukast (SINGULAIR) 10 MG tablet          pantoprazole (PROTONIX) 40 MG tablet          simvastatin (ZOCOR) 20 MG tablet          Spiriva Respimat 2.5 MCG/ACT inhalation spray          tamsulosin (FLOMAX) 0.4 MG Cap          triamterene-hydrochlorothiazide (MAXZIDE-25) 37.5-25 MG per tablet          Ventolin HFA 108 (90 Base) MCG/ACT inhaler              Allergies: He has No Known Allergies.            VISIT INFORMATION        Clinical Course in the ED:               Medications Given in the ED:    .     ED Medication Orders (From admission, onward)    Start Ordered     Status Ordering Provider    10/15/19 2251 10/15/19 2250  predniSONE (DELTASONE) tablet 60 mg  Once     Route: Oral  Ordered Dose: 60 mg     Last MAR action: Given Virl Cagey    10/15/19 2032 10/15/19 2033  labetalol (NORMODYNE,TRANDATE) injection 10 mg  Once     Route: Intravenous  Ordered Dose: 10 mg     Last MAR action: Given Lucretia Kern     10/15/19 2029 10/15/19 2029  iohexol (OMNIPAQUE) 350 MG/ML injection 100 mL  IMG once as needed     Route: Intravenous  Ordered Dose: 100 mL     Last MAR action: Imaging Agent Given Virl Cagey    10/15/19 2026 10/15/19 2026       Note to Pharmacy: Minta Balsam, Meagan: cabinet override    Discontinued     10/15/19 2026 10/15/19 2026       Note to Pharmacy: Minta Balsam, Meagan: cabinet override    Discontinued     10/15/19 2026 10/15/19 2026       Note to Pharmacy: Singletary, Meagan: cabinet override    Discontinued             Procedures:      Procedures      Interpretations:      O2 sat-           saturation: 96 %; Oxygen use: room air; Interpretation: Normal    Monitor -         interpreted by me: normal sinus at 85.  EKG Interpretation  Reviewed and Interpreted by ER physician, Idamae Schuller, MD   Rhythm: Sinus  Rate: 95  Intervals/conduction: RBBB  ST Segments/T wave: Nonspecific ischemic findings    Critical Care Time(not including procedures): 90 minutes.   Due to the high risk of critical illness or multi-organ failure at initial presentation and/or during ED course.    System(s) at risk for compromise:  circulatory and CNS  Critical Diagnosis:   1. Bell's palsy    2. Facial numbness    3. Hypertension, unspecified type         The patient was Hypotensive:   No     The patient was Hypoxic:   No     This does not including time spent performing other reported procedures or services.   Critical care time involved full attention to the patient's condition and included:   Review of nursing notes and/or old charts - Yes  Documentation time - Yes  Care, transfer of care, and discharge plans - Yes  Obtaining necessary history from family, EMS, nursing home staff and/or treating physicians - Yes  Review of medications, allergies, and vital signs - Yes   Consultant collaboration on findings and treatment options - Yes  Ordering, interpreting, and reviewing diagnostic studies/tab tests - Yes        NIH Stroke Score      Most Recent Value   Patient's calculated Stroke Score:  4 filed at 10/15/2019 2035                RESULTS        Lab Results: Laboratory results reviewed by EDP (if ordered): YES      Results     Procedure Component Value Units Date/Time    Type and Screen [[id number]] Collected: 10/15/19 2036    Specimen: Blood Updated: 10/15/19 2242     ABO Rh O POS     AB Screen Gel NEG    Troponin I [[id number]] Collected: 10/15/19 2036    Specimen: Blood Updated: 10/15/19 2150     Troponin I 0.02 ng/mL     GFR [[id number]] Collected: 10/15/19 2036     Updated: 10/15/19 2144     EGFR 59.7    Comprehensive metabolic panel [[id number]]  (Abnormal) Collected: 10/15/19 2036    Specimen: Blood Updated: 10/15/19 2144     Glucose 143 mg/dL      BUN 62.9 mg/dL      Creatinine 1.2 mg/dL      Sodium 528 mEq/L      Potassium 3.4 mEq/L      Chloride 99 mEq/L      CO2 26 mEq/L      Calcium 9.1 mg/dL      Protein, Total 7.0 g/dL      Albumin 4.2 g/dL      AST (SGOT) 26 U/L      ALT 26 U/L      Alkaline Phosphatase 69 U/L      Bilirubin, Total 0.5 mg/dL      Globulin 2.8 g/dL      Albumin/Globulin Ratio 1.5     Anion Gap 15.0    APTT [[id number]] Collected: 10/15/19 2036     Updated: 10/15/19 2139     PTT 37 sec     Prothrombin time/INR [[id number]] Collected: 10/15/19 2036    Specimen: Blood Updated: 10/15/19 2139     PT 13.7 sec      PT INR 1.1    CBC and differential [[id number]]  (  Abnormal) Collected: 10/15/19 2036    Specimen: Blood Updated: 10/15/19 2132     WBC 11.77 x10 3/uL      Hgb 15.2 g/dL      Hematocrit 16.1 %      Platelets 232 x10 3/uL      RBC 5.10 x10 6/uL      MCV 89.0 fL      MCH 29.8 pg      MCHC 33.5 g/dL      RDW 13 %      MPV 10.2 fL      Neutrophils 75.4 %      Lymphocytes Automated 15.4 %      Monocytes 5.8 %      Eosinophils Automated 2.0 %      Basophils Automated 0.6 %      Immature Granulocytes 0.8 %      Nucleated RBC 0.0 /100 WBC      Neutrophils Absolute 8.88 x10 3/uL      Lymphocytes  Absolute Automated 1.81 x10 3/uL      Monocytes Absolute Automated 0.68 x10 3/uL      Eosinophils Absolute Automated 0.23 x10 3/uL      Basophils Absolute Automated 0.07 x10 3/uL      Immature Granulocytes Absolute 0.10 x10 3/uL      Absolute NRBC 0.00 x10 3/uL     Glucose Whole Blood - POCT [[id number]]  (Abnormal) Collected: 10/15/19 2014     Updated: 10/15/19 2016     Whole Blood Glucose POCT 159 mg/dL               Radiology Results: Radiology results reviewed by EDP (if ordered): YES      Chest  AP Portable   Final Result       Interstitial prominence without focal infiltrate.      Prince Solian, MD    10/15/2019 9:37 PM      MRI Brain WO Contrast Rapid Stroke Protocol (if CTA contraindicated)   Final Result   Addendum 1 of 1   A 3-D time-of-flight unenhanced MRA of the head was also performed. This   demonstrates patent and normal appearing intracranial internal carotid   arteries, normal basilar artery, unremarkable appearing proximal   visualized portions of the anterior, middle and posterior cerebral   arteries.      Terrilee Croak, MD    10/15/2019 9:37 PM      Final      CT Angiogram Head Neck   Final Result      1. There is 30% stenosis of the proximal right internal carotid artery   based on NASCET criteria.     2. There is no stenosis of the proximal left internal carotid artery   based on NASCET criteria.   3. The vertebrobasilar arterial system is patent.    4. Minor atherosclerotic changes in the intracranial internal carotid   arteries.   5. There are automated analysis demonstrates slightly decreased vessel   intensity in the territory of the left middle cerebral artery. No   definite proximal large vessel occlusion is seen.      Terrilee Croak, MD    10/15/2019 9:01 PM      CT Head WO Contrast   Final Result    Minor supratentorial leukomalacia likely on the basis of a   minor chronic small vessel ischemia.      Terrilee Croak, MD    10/15/2019 8:35 PM  CLINICAL SUMMARY           Diagnosis:    .     Final diagnoses:   Bell's palsy   Facial numbness   Hypertension, unspecified type         MDM Notes:       Patient presenting with left-sided facial numbness and facial droop into the TPA window, made stroke alert by triage physician.  Evaluated by stroke team had CTA and CT angio which were unremarkable.  Neuro intensivist recommending stat MRI to rule out CVA prior to decision about TPA.  Stat MRI negative for any ischemic CVA.  Stroke/[facility] state no further stroke work-up is negative MRI, likely a Bell's palsy.  Patient given a dose of blood pressure medication initially with improvement of his blood pressure.  Observed in the ER for several hours with no worsening of symptoms.  Offered admission for patient for observation for blood pressure management and but patient declined.  Will start him on 1 week of prednisone for Bell's palsy recommend follow-up with his [facility] team, return if any worsening symptoms.  Also recommend monitoring his blood sugars while on prednisone.      Disposition:         Discharge           I was present for the bedside discharge alongside the patient's ED nurse. Course of visit in the ED and discharge instructions were reviewed with patient and they were given the opportunity to ask any questions regarding their care today. Patient and/ or patient's family verbalized understanding of, and comfort with, instructions and plan.          Discharge Prescriptions     Medication Sig Dispense Auth. Provider    predniSONE (DELTASONE) 20 MG tablet Take 3 tablets (60 mg total) by mouth daily for 7 days 21 tablet Virl Cagey, MD                      Scribe Attestation:      I was acting as a Neurosurgeon for Virl Cagey, MD on [facility]       I am the first attending provider for this patient and I personally performed the services documented.  is scribing for me on Ralph Barron,Ralph Barron. This note accurately reflects work and decisions made by  me.  Virl Cagey, MD          Virl Cagey, MD  10/18/19 Nida Boatman       Virl Cagey, MD  10/18/19 [phone]

## 2019-10-16 LAB — ECG 12-LEAD
Atrial Rate: 97 {beats}/min
Q-T Interval: 424 ms
QRS Duration: 160 ms
QTC Calculation (Bezet): 473 ms
R Axis: -52 degrees
T Axis: 21 degrees
Ventricular Rate: 75 {beats}/min

## 2020-07-19 DIAGNOSIS — D3502 Benign neoplasm of left adrenal gland: Secondary | ICD-10-CM | POA: Diagnosis present

## 2021-03-25 ENCOUNTER — Emergency Department: Payer: Medicare Other

## 2021-03-25 ENCOUNTER — Inpatient Hospital Stay
Admission: EM | Admit: 2021-03-25 | Discharge: 2021-03-27 | DRG: 871 | Disposition: A | Discharge status: Home / Self Care | Payer: Medicare Other | Attending: Internal Medicine | Admitting: Internal Medicine

## 2021-03-25 ENCOUNTER — Other Ambulatory Visit: Payer: Self-pay

## 2021-03-25 DIAGNOSIS — I10 Essential (primary) hypertension: Secondary | ICD-10-CM | POA: Diagnosis present

## 2021-03-25 DIAGNOSIS — Z87891 Personal history of nicotine dependence: Secondary | ICD-10-CM

## 2021-03-25 DIAGNOSIS — D3502 Benign neoplasm of left adrenal gland: Secondary | ICD-10-CM | POA: Diagnosis present

## 2021-03-25 DIAGNOSIS — A419 Sepsis, unspecified organism: Principal | ICD-10-CM

## 2021-03-25 DIAGNOSIS — H903 Sensorineural hearing loss, bilateral: Secondary | ICD-10-CM | POA: Diagnosis present

## 2021-03-25 DIAGNOSIS — Z8719 Personal history of other diseases of the digestive system: Secondary | ICD-10-CM

## 2021-03-25 DIAGNOSIS — J449 Chronic obstructive pulmonary disease, unspecified: Secondary | ICD-10-CM | POA: Diagnosis present

## 2021-03-25 DIAGNOSIS — Z8052 Family history of malignant neoplasm of bladder: Secondary | ICD-10-CM

## 2021-03-25 DIAGNOSIS — K219 Gastro-esophageal reflux disease without esophagitis: Secondary | ICD-10-CM | POA: Diagnosis present

## 2021-03-25 DIAGNOSIS — Z800 Family history of malignant neoplasm of digestive organs: Secondary | ICD-10-CM

## 2021-03-25 DIAGNOSIS — J9621 Acute and chronic respiratory failure with hypoxia: Secondary | ICD-10-CM | POA: Diagnosis present

## 2021-03-25 DIAGNOSIS — I129 Hypertensive chronic kidney disease with stage 1 through stage 4 chronic kidney disease, or unspecified chronic kidney disease: Secondary | ICD-10-CM | POA: Diagnosis present

## 2021-03-25 DIAGNOSIS — Z9981 Dependence on supplemental oxygen: Secondary | ICD-10-CM

## 2021-03-25 DIAGNOSIS — E1149 Type 2 diabetes mellitus with other diabetic neurological complication: Secondary | ICD-10-CM

## 2021-03-25 DIAGNOSIS — R0602 Shortness of breath: Secondary | ICD-10-CM | POA: Diagnosis not present

## 2021-03-25 DIAGNOSIS — E1165 Type 2 diabetes mellitus with hyperglycemia: Secondary | ICD-10-CM | POA: Diagnosis present

## 2021-03-25 DIAGNOSIS — N400 Enlarged prostate without lower urinary tract symptoms: Secondary | ICD-10-CM | POA: Diagnosis present

## 2021-03-25 DIAGNOSIS — R0902 Hypoxemia: Secondary | ICD-10-CM

## 2021-03-25 DIAGNOSIS — J189 Pneumonia, unspecified organism: Secondary | ICD-10-CM

## 2021-03-25 DIAGNOSIS — Z79899 Other long term (current) drug therapy: Secondary | ICD-10-CM

## 2021-03-25 DIAGNOSIS — G4733 Obstructive sleep apnea (adult) (pediatric): Secondary | ICD-10-CM | POA: Diagnosis present

## 2021-03-25 DIAGNOSIS — Z6841 Body Mass Index (BMI) 40.0 and over, adult: Secondary | ICD-10-CM

## 2021-03-25 DIAGNOSIS — E7800 Pure hypercholesterolemia, unspecified: Secondary | ICD-10-CM | POA: Diagnosis present

## 2021-03-25 DIAGNOSIS — E1142 Type 2 diabetes mellitus with diabetic polyneuropathy: Secondary | ICD-10-CM | POA: Diagnosis present

## 2021-03-25 DIAGNOSIS — Z8042 Family history of malignant neoplasm of prostate: Secondary | ICD-10-CM

## 2021-03-25 DIAGNOSIS — J441 Chronic obstructive pulmonary disease with (acute) exacerbation: Secondary | ICD-10-CM | POA: Diagnosis present

## 2021-03-25 DIAGNOSIS — R509 Fever, unspecified: Secondary | ICD-10-CM

## 2021-03-25 DIAGNOSIS — E1122 Type 2 diabetes mellitus with diabetic chronic kidney disease: Secondary | ICD-10-CM | POA: Diagnosis present

## 2021-03-25 DIAGNOSIS — J440 Chronic obstructive pulmonary disease with acute lower respiratory infection: Secondary | ICD-10-CM | POA: Diagnosis present

## 2021-03-25 DIAGNOSIS — H905 Unspecified sensorineural hearing loss: Secondary | ICD-10-CM | POA: Diagnosis present

## 2021-03-25 DIAGNOSIS — Z7984 Long term (current) use of oral hypoglycemic drugs: Secondary | ICD-10-CM

## 2021-03-25 DIAGNOSIS — Z20822 Contact with and (suspected) exposure to covid-19: Secondary | ICD-10-CM | POA: Diagnosis present

## 2021-03-25 DIAGNOSIS — N182 Chronic kidney disease, stage 2 (mild): Secondary | ICD-10-CM | POA: Diagnosis present

## 2021-03-25 HISTORY — DX: Type 2 diabetes mellitus without complications: E11.9

## 2021-03-25 LAB — CBC
HCT: 43.9 % (ref 39.0–52.0)
Hemoglobin: 15.1 g/dL (ref 13.0–17.0)
MCH: 30.8 pg (ref 26.0–34.0)
MCHC: 34.4 g/dL (ref 30.0–36.0)
MCV: 89.6 fL (ref 80.0–100.0)
Platelets: 187 10*3/uL (ref 150–400)
RBC: 4.9 MIL/uL (ref 4.22–5.81)
RDW: 13.7 % (ref 11.5–15.5)
WBC: 16.5 10*3/uL — ABNORMAL HIGH (ref 4.0–10.5)
nRBC: 0 % (ref 0.0–0.2)

## 2021-03-25 LAB — BASIC METABOLIC PANEL
Anion gap: 13 (ref 5–15)
BUN: 19 mg/dL (ref 8–23)
CO2: 29 mmol/L (ref 22–32)
Calcium: 9.2 mg/dL (ref 8.9–10.3)
Chloride: 96 mmol/L — ABNORMAL LOW (ref 98–111)
Creatinine, Ser: 1.1 mg/dL (ref 0.61–1.24)
GFR, Estimated: >60 mL/min (ref >60)
Glucose, Bld: 274 mg/dL — ABNORMAL HIGH (ref 70–99)
Potassium: 3 mmol/L — ABNORMAL LOW (ref 3.5–5.1)
Sodium: 138 mmol/L (ref 135–145)

## 2021-03-25 LAB — PROCALCITONIN: Procalcitonin: 0.19 ng/mL

## 2021-03-25 MED ORDER — LACTATED RINGERS IV BOLUS (SEPSIS)
1000.0000 mL | Freq: Once | INTRAVENOUS | Status: AC
  Administered 2021-03-25: 1000 mL via INTRAVENOUS

## 2021-03-25 MED ORDER — LACTATED RINGERS IV BOLUS (SEPSIS): 1000.0000 mL | Freq: Once | INTRAVENOUS | Status: DC

## 2021-03-25 MED ORDER — LACTATED RINGERS IV SOLN: INTRAVENOUS | Status: DC

## 2021-03-25 MED ORDER — LACTATED RINGERS IV BOLUS (SEPSIS)
1000.0000 mL | Freq: Once | INTRAVENOUS | Status: AC
  Administered 2021-03-26: 1000 mL via INTRAVENOUS

## 2021-03-25 MED ORDER — POTASSIUM CHLORIDE CRYS ER 20 MEQ PO TBCR
40.0000 meq | EXTENDED_RELEASE_TABLET | Freq: Once | ORAL | Status: AC
  Administered 2021-03-25: 40 meq via ORAL
  Filled 2021-03-25: qty 2

## 2021-03-25 MED ORDER — SODIUM CHLORIDE 0.9 % IV SOLN
500.0000 mg | INTRAVENOUS | Status: DC
  Administered 2021-03-26 – 2021-03-27 (×2): 500 mg via INTRAVENOUS
  Filled 2021-03-25 (×3): qty 500

## 2021-03-25 MED ORDER — HYDROCOD POLST-CPM POLST ER 10-8 MG/5ML PO SUER
5.0000 mL | Freq: Once | ORAL | Status: AC
Start: 2021-03-25 — End: 2021-03-25
  Administered 2021-03-25: 5 mL via ORAL
  Filled 2021-03-25: qty 5

## 2021-03-25 MED ORDER — ACETAMINOPHEN 500 MG PO TABS
1000.0000 mg | ORAL_TABLET | Freq: Once | ORAL | Status: AC
  Administered 2021-03-25: 1000 mg via ORAL
  Filled 2021-03-25: qty 2

## 2021-03-25 MED ORDER — SODIUM CHLORIDE 0.9 % IV SOLN
2.0000 g | INTRAVENOUS | Status: DC
  Administered 2021-03-26 – 2021-03-27 (×2): 2 g via INTRAVENOUS
  Filled 2021-03-25 (×3): qty 20

## 2021-03-25 NOTE — Progress Notes (Signed)
CODE SEPSIS - PHARMACY COMMUNICATION  **Broad Spectrum Antibiotics should be administered within 1 hour of Sepsis diagnosis**  Time Code Sepsis Called/Page Received: 2317  Antibiotics Ordered: Azithromycin and Ceftriaxone  Time of 1st antibiotic administration: 0004  Renda Rolls, PharmD, MBA 03/26/2021 12:10 AM

## 2021-03-25 NOTE — ED Notes (Signed)
xr at bedside

## 2021-03-25 NOTE — Sepsis Progress Note (Signed)
Following per Sepsis protocol

## 2021-03-25 NOTE — ED Notes (Signed)
Procalcitonin add on via lab.

## 2021-03-25 NOTE — ED Triage Notes (Signed)
Pt presents via POV c/o 02 saturation high 80s to low 90s at home. Reports hx COPD on 2L 02. Reports "chest cold". + productive cough. Speaking in complete sentences.

## 2021-03-25 NOTE — ED Provider Notes (Signed)
[facility] Emergency Department Provider Note   ____________________________________________   Event Date/Time   First MD Initiated Contact with Patient 03/25/21 2309     (approximate)  I have reviewed the triage vital signs and the nursing notes.   HISTORY  Chief Complaint Shortness of Breath    HPI Dalton White is a 70 y.o. male who presents to the ED from home with a chief complaint of productive cough and shortness of breath.  History of COPD on 2 L nasal cannula oxygen.  Saturations mid 80s to low 90s; placed on 3 L oxygen with sats to 93%.  Complaints of chest pain with coughing.  Denies abdominal pain, nausea, vomiting or diarrhea.     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   Prior to Admission medications   Not on File    Allergies Patient has no known allergies.  No family history on file.  Social History    Review of  Systems  Constitutional: Positive for fever/chills Eyes: No visual changes. ENT: No sore throat. Cardiovascular: Positive for chest pain. Respiratory: Positive for productive cough and shortness of breath. Gastrointestinal: No abdominal pain.  No nausea, no vomiting.  No diarrhea.  No constipation. Genitourinary: Negative for dysuria. Musculoskeletal: Negative for back pain. Skin: Negative for rash. Neurological: Negative for headaches, focal weakness or numbness.   ____________________________________________   PHYSICAL EXAM:  VITAL SIGNS: ED Triage Vitals  Enc Vitals Group     BP 03/25/21 2236 (!) 169/92     Pulse Rate 03/25/21 2236 (!) 121     Resp 03/25/21 2230 (!) 24     Temp 03/25/21 2236 (!) 100.4 F (38 C)     Temp Source 03/25/21 2236 Oral     SpO2 03/25/21 2236 92 %     Weight --      Height --      Head Circumference --      Peak Flow --      Pain Score --      Pain Loc --      Pain Edu? --      Excl. in [place]? --     Constitutional: Alert and oriented. Elderly appearing and in mild acute distress. Eyes: Conjunctivae are normal. PERRL. EOMI. Head: Atraumatic. Nose: No congestion/rhinnorhea. Mouth/Throat: Mucous membranes are mildly dry. Neck: No stridor.   Cardiovascular: Tachycardic rate, regular rhythm. Grossly normal heart sounds.  Good peripheral circulation. Respiratory: Increased respiratory effort.  No retractions. Lungs with coarse rhonchi. Gastrointestinal: Soft and nontender. No distention. No abdominal bruits. No CVA tenderness. Musculoskeletal: No lower extremity tenderness nor edema.  No joint effusions. Neurologic:  Normal speech and language. No gross focal neurologic deficits are appreciated.  Skin:  Skin is warm, dry and intact. No rash noted. Psychiatric: Mood and affect are normal. Speech and behavior are normal.  ____________________________________________   LABS (all labs ordered are listed, but only abnormal results are  displayed)  Labs Reviewed  CBC - Abnormal; Notable for the following components:      Result Value   WBC 16.5 (*)    All other components within normal limits  BASIC METABOLIC PANEL - Abnormal; Notable for the following components:   Potassium 3.0 (*)    Chloride 96 (*)    Glucose, Bld 274 (*)    All other components within normal limits  CULTURE, BLOOD (ROUTINE X 2)  CULTURE, BLOOD (ROUTINE X 2)  RESP PANEL BY RT-PCR (FLU A&B, COVID) ARPGX2  LACTIC ACID, PLASMA  LACTIC ACID, PLASMA  PROCALCITONIN   ____________________________________________  EKG  ED ECG REPORT I, Tamani Durney J, the attending physician, personally viewed and interpreted this ECG.   Date: 03/26/2021  EKG Time: 2232  Rate: 122  Rhythm: sinus tachycardia  Axis: LAD  Intervals:left anterior fascicular block  ST&T Change: Nonspecific  ____________________________________________  RADIOLOGY Cecilie Kicks J, personally viewed and evaluated these images (plain radiographs) as part of my medical decision making, as well as reviewing the written report by the radiologist.  ED MD interpretation:  Bilateral opacities  Official radiology report(s): DG Chest Portable 1 View  Result Date: 03/25/2021 CLINICAL DATA:  Shortness of breath. EXAM: PORTABLE CHEST 1 VIEW COMPARISON:  Chest x-ray 02/22/2010 FINDINGS: The heart size and mediastinal contours are unchanged. Aortic calcifications. Interval development of right base patchy airspace opacities. Similar left base findings to a lesser extent. No pulmonary edema. No pleural effusion. No pneumothorax. No acute osseous abnormality. IMPRESSION: Bibasilar, right greater than left, patchy airspace opacities could represent infection/inflammation versus atelectasis. Followup PA and lateral chest X-ray is recommended in 3-4 weeks following therapy to ensure resolution and exclude underlying malignancy. Electronically Signed   By: Iven Finn M.D.   On: 03/25/2021 23:05     ____________________________________________   PROCEDURES  Procedure(s) performed (including Critical Care):  .1-3 Lead EKG Interpretation  Date/Time: 03/26/2021 12:01 AM Performed by: Paulette Blanch, MD Authorized by: Paulette Blanch, MD     Interpretation: abnormal     ECG rate:  115   ECG rate assessment: tachycardic     Rhythm: sinus tachycardia     Ectopy: none     Conduction: normal   Comments:     Patient placed on cardiac monitor to evaluate for arrhythmias  CRITICAL CARE Performed by: Paulette Blanch   Total critical care time: 30 minutes  Critical care time was exclusive of separately billable procedures and treating other patients.  Critical care was necessary to treat or prevent imminent or life-threatening deterioration.  Critical care was time spent personally by me on the following activities: development of treatment plan with patient and/or surrogate as well as nursing, discussions with consultants, evaluation of patient's response to treatment, examination of patient, obtaining history from patient or surrogate, ordering and performing treatments and interventions, ordering and review of laboratory studies, ordering and review of radiographic studies, pulse oximetry and re-evaluation of patient's condition.  ____________________________________________   INITIAL IMPRESSION / ASSESSMENT AND PLAN / ED COURSE  As part of my medical decision making, I reviewed the following data within the [place] notes reviewed and incorporated, Labs reviewed, EKG interpreted, Old chart reviewed, Radiograph reviewed, Discussed with admitting physician, and Notes from prior ED  visits     70 year old male with COPD on 2 L chronic oxygen presents with fever, productive cough, tachycardia and hypoxia. Differential includes, but is not limited to, viral syndrome, bronchitis including COPD exacerbation, pneumonia, reactive airway disease including asthma, CHF  including exacerbation with or without pulmonary/interstitial edema, pneumothorax, ACS, thoracic trauma, and pulmonary embolism.   Patient meets sepsis criteria.  Code sepsis will replete potassium.  Add procalcitonin, respiratory panel.  Anticipate hospitalization.  Initiated.  Broad-spectrum IV antibiotics ordered.  Clinical Course as of 03/26/21 2536  Fri Mar 25, 2021  2316 Will start with 2 L IV Lactated Ringer's for now as not to overload the patient's lungs pending lactic acid. [JS]    Clinical Course User Index [JS] Paulette Blanch, MD     ____________________________________________   FINAL CLINICAL IMPRESSION(S) / ED DIAGNOSES  Final diagnoses:  Sepsis, due to unspecified organism, unspecified whether acute organ dysfunction present [facility])  Community acquired pneumonia, unspecified laterality  Hypoxia  Fever, unspecified fever cause     ED Discharge Orders     None        Note:  This document was prepared using Dragon voice recognition software and may include unintentional dictation errors.    Paulette Blanch, MD 03/26/21 [phone]

## 2021-03-26 ENCOUNTER — Encounter: Payer: Self-pay | Admitting: Internal Medicine

## 2021-03-26 DIAGNOSIS — R0602 Shortness of breath: Secondary | ICD-10-CM | POA: Diagnosis present

## 2021-03-26 DIAGNOSIS — J9621 Acute and chronic respiratory failure with hypoxia: Secondary | ICD-10-CM | POA: Diagnosis present

## 2021-03-26 DIAGNOSIS — Z9981 Dependence on supplemental oxygen: Secondary | ICD-10-CM | POA: Diagnosis not present

## 2021-03-26 DIAGNOSIS — J189 Pneumonia, unspecified organism: Secondary | ICD-10-CM

## 2021-03-26 DIAGNOSIS — J441 Chronic obstructive pulmonary disease with (acute) exacerbation: Secondary | ICD-10-CM | POA: Diagnosis present

## 2021-03-26 DIAGNOSIS — Z20822 Contact with and (suspected) exposure to covid-19: Secondary | ICD-10-CM | POA: Diagnosis present

## 2021-03-26 DIAGNOSIS — Z8052 Family history of malignant neoplasm of bladder: Secondary | ICD-10-CM | POA: Diagnosis not present

## 2021-03-26 DIAGNOSIS — N182 Chronic kidney disease, stage 2 (mild): Secondary | ICD-10-CM | POA: Diagnosis present

## 2021-03-26 DIAGNOSIS — E1122 Type 2 diabetes mellitus with diabetic chronic kidney disease: Secondary | ICD-10-CM | POA: Diagnosis present

## 2021-03-26 DIAGNOSIS — K219 Gastro-esophageal reflux disease without esophagitis: Secondary | ICD-10-CM | POA: Diagnosis present

## 2021-03-26 DIAGNOSIS — Z87891 Personal history of nicotine dependence: Secondary | ICD-10-CM | POA: Diagnosis not present

## 2021-03-26 DIAGNOSIS — Z800 Family history of malignant neoplasm of digestive organs: Secondary | ICD-10-CM | POA: Diagnosis not present

## 2021-03-26 DIAGNOSIS — D3502 Benign neoplasm of left adrenal gland: Secondary | ICD-10-CM | POA: Diagnosis present

## 2021-03-26 DIAGNOSIS — Z8042 Family history of malignant neoplasm of prostate: Secondary | ICD-10-CM | POA: Diagnosis not present

## 2021-03-26 DIAGNOSIS — E1165 Type 2 diabetes mellitus with hyperglycemia: Secondary | ICD-10-CM | POA: Diagnosis present

## 2021-03-26 DIAGNOSIS — E7800 Pure hypercholesterolemia, unspecified: Secondary | ICD-10-CM | POA: Diagnosis present

## 2021-03-26 DIAGNOSIS — A419 Sepsis, unspecified organism: Principal | ICD-10-CM

## 2021-03-26 DIAGNOSIS — Z8719 Personal history of other diseases of the digestive system: Secondary | ICD-10-CM | POA: Diagnosis not present

## 2021-03-26 DIAGNOSIS — H903 Sensorineural hearing loss, bilateral: Secondary | ICD-10-CM | POA: Diagnosis present

## 2021-03-26 DIAGNOSIS — J440 Chronic obstructive pulmonary disease with acute lower respiratory infection: Secondary | ICD-10-CM | POA: Diagnosis present

## 2021-03-26 DIAGNOSIS — E1142 Type 2 diabetes mellitus with diabetic polyneuropathy: Secondary | ICD-10-CM | POA: Diagnosis present

## 2021-03-26 DIAGNOSIS — Z6841 Body Mass Index (BMI) 40.0 and over, adult: Secondary | ICD-10-CM | POA: Diagnosis not present

## 2021-03-26 DIAGNOSIS — G4733 Obstructive sleep apnea (adult) (pediatric): Secondary | ICD-10-CM | POA: Diagnosis present

## 2021-03-26 DIAGNOSIS — I129 Hypertensive chronic kidney disease with stage 1 through stage 4 chronic kidney disease, or unspecified chronic kidney disease: Secondary | ICD-10-CM | POA: Diagnosis present

## 2021-03-26 LAB — CBC
HCT: 40.5 % (ref 39.0–52.0)
Hemoglobin: 13.6 g/dL (ref 13.0–17.0)
MCH: 30.2 pg (ref 26.0–34.0)
MCHC: 33.6 g/dL (ref 30.0–36.0)
MCV: 89.8 fL (ref 80.0–100.0)
Platelets: 188 10*3/uL (ref 150–400)
RBC: 4.51 MIL/uL (ref 4.22–5.81)
RDW: 13.7 % (ref 11.5–15.5)
WBC: 15.4 10*3/uL — ABNORMAL HIGH (ref 4.0–10.5)
nRBC: 0 % (ref 0.0–0.2)

## 2021-03-26 LAB — RESP PANEL BY RT-PCR (FLU A&B, COVID) ARPGX2
Influenza A by PCR: NEGATIVE
Influenza B by PCR: NEGATIVE
SARS Coronavirus 2 by RT PCR: NEGATIVE

## 2021-03-26 LAB — COMPREHENSIVE METABOLIC PANEL
ALT: 21 U/L (ref 0–44)
AST: 20 U/L (ref 15–41)
Albumin: 3.1 g/dL — ABNORMAL LOW (ref 3.5–5.0)
Alkaline Phosphatase: 53 U/L (ref 38–126)
Anion gap: 11 (ref 5–15)
BUN: 16 mg/dL (ref 8–23)
CO2: 30 mmol/L (ref 22–32)
Calcium: 8.5 mg/dL — ABNORMAL LOW (ref 8.9–10.3)
Chloride: 98 mmol/L (ref 98–111)
Creatinine, Ser: 0.88 mg/dL (ref 0.61–1.24)
GFR, Estimated: >60 mL/min (ref >60)
Glucose, Bld: 210 mg/dL — ABNORMAL HIGH (ref 70–99)
Potassium: 3.2 mmol/L — ABNORMAL LOW (ref 3.5–5.1)
Sodium: 139 mmol/L (ref 135–145)
Total Bilirubin: 0.9 mg/dL (ref 0.3–1.2)
Total Protein: 6 g/dL — ABNORMAL LOW (ref 6.5–8.1)

## 2021-03-26 LAB — URINALYSIS, COMPLETE (UACMP) WITH MICROSCOPIC
Bacteria, UA: NONE SEEN
Bilirubin Urine: NEGATIVE
Glucose, UA: >=500 mg/dL — AB
Hgb urine dipstick: NEGATIVE
Ketones, ur: NEGATIVE mg/dL
Leukocytes,Ua: NEGATIVE
Nitrite: NEGATIVE
Protein, ur: >=300 mg/dL — AB
Specific Gravity, Urine: 1.024 (ref 1.005–1.030)
Squamous Epithelial / HPF: NONE SEEN (ref 0–5)
pH: 5 (ref 5.0–8.0)

## 2021-03-26 LAB — CBG MONITORING, ED
Glucose-Capillary: 187 mg/dL — ABNORMAL HIGH (ref 70–99)
Glucose-Capillary: 339 mg/dL — ABNORMAL HIGH (ref 70–99)
Glucose-Capillary: 282 mg/dL — ABNORMAL HIGH (ref 70–99)

## 2021-03-26 LAB — LACTIC ACID, PLASMA
Lactic Acid, Venous: 1.7 mmol/L (ref 0.5–1.9)
Lactic Acid, Venous: 1.8 mmol/L (ref 0.5–1.9)

## 2021-03-26 LAB — CORTISOL-AM, BLOOD: Cortisol - AM: 21.5 ug/dL (ref 6.7–22.6)

## 2021-03-26 LAB — PROTIME-INR
INR: 1.1 (ref 0.8–1.2)
Prothrombin Time: 14.4 seconds (ref 11.4–15.2)

## 2021-03-26 LAB — PROCALCITONIN: Procalcitonin: 0.74 ng/mL

## 2021-03-26 MED ORDER — ENOXAPARIN SODIUM 60 MG/0.6ML IJ SOSY
0.5000 mg/kg | PREFILLED_SYRINGE | INTRAMUSCULAR | Status: DC
  Administered 2021-03-26: 62.5 mg via SUBCUTANEOUS

## 2021-03-26 MED ORDER — ACETAMINOPHEN 650 MG RE SUPP: 650.0000 mg | Freq: Four times a day (QID) | RECTAL | Status: DC | PRN

## 2021-03-26 MED ORDER — GLIPIZIDE 5 MG PO TABS: 5.0000 mg | ORAL_TABLET | Freq: Every day | ORAL | Status: DC

## 2021-03-26 MED ORDER — INSULIN ASPART 100 UNIT/ML IJ SOLN
0.0000 [IU] | Freq: Three times a day (TID) | INTRAMUSCULAR | Status: DC
  Administered 2021-03-26: 15 [IU] via SUBCUTANEOUS
  Administered 2021-03-26: 11 [IU] via SUBCUTANEOUS
  Administered 2021-03-27 (×2): 7 [IU] via SUBCUTANEOUS
  Filled 2021-03-26 (×5): qty 1

## 2021-03-26 MED ORDER — PIOGLITAZONE HCL 15 MG PO TABS
15.0000 mg | ORAL_TABLET | Freq: Every day | ORAL | Status: DC
  Administered 2021-03-27: 15 mg via ORAL
  Filled 2021-03-26: qty 1

## 2021-03-26 MED ORDER — GLIPIZIDE 5 MG PO TABS
10.0000 mg | ORAL_TABLET | Freq: Every day | ORAL | Status: DC
  Administered 2021-03-27: 10 mg via ORAL
  Filled 2021-03-26: qty 1
  Filled 2021-03-26: qty 2

## 2021-03-26 MED ORDER — AZELASTINE HCL 0.1 % NA SOLN
1.0000 | Freq: Two times a day (BID) | NASAL | Status: DC
  Administered 2021-03-27 (×2): 1 via NASAL
  Filled 2021-03-26: qty 30

## 2021-03-26 MED ORDER — TIOTROPIUM BROMIDE MONOHYDRATE 18 MCG IN CAPS
1.0000 | ORAL_CAPSULE | Freq: Every day | RESPIRATORY_TRACT | Status: DC
  Administered 2021-03-27: 18 ug via RESPIRATORY_TRACT
  Filled 2021-03-26: qty 5

## 2021-03-26 MED ORDER — MOMETASONE FURO-FORMOTEROL FUM 200-5 MCG/ACT IN AERO
2.0000 | INHALATION_SPRAY | Freq: Two times a day (BID) | RESPIRATORY_TRACT | Status: DC
  Administered 2021-03-27 (×2): 2 via RESPIRATORY_TRACT
  Filled 2021-03-26: qty 8.8

## 2021-03-26 MED ORDER — ONDANSETRON HCL 4 MG/2ML IJ SOLN: 4.0000 mg | Freq: Four times a day (QID) | INTRAMUSCULAR | Status: DC | PRN

## 2021-03-26 MED ORDER — POTASSIUM CHLORIDE CRYS ER 20 MEQ PO TBCR
40.0000 meq | EXTENDED_RELEASE_TABLET | Freq: Once | ORAL | Status: AC
  Administered 2021-03-26: 40 meq via ORAL

## 2021-03-26 MED ORDER — FERROUS SULFATE 325 (65 FE) MG PO TABS
325.0000 mg | ORAL_TABLET | Freq: Two times a day (BID) | ORAL | Status: DC
  Administered 2021-03-27: 325 mg via ORAL
  Filled 2021-03-26: qty 1

## 2021-03-26 MED ORDER — SODIUM CHLORIDE 0.9 % IV SOLN: 500.0000 mg | INTRAVENOUS | Status: DC

## 2021-03-26 MED ORDER — INSULIN ASPART 100 UNIT/ML IJ SOLN: 0.0000 [IU] | Freq: Every day | INTRAMUSCULAR | Status: DC

## 2021-03-26 MED ORDER — ACETAMINOPHEN 325 MG PO TABS: 650.0000 mg | ORAL_TABLET | Freq: Four times a day (QID) | ORAL | Status: DC | PRN

## 2021-03-26 MED ORDER — TAMSULOSIN HCL 0.4 MG PO CAPS
0.4000 mg | ORAL_CAPSULE | Freq: Every day | ORAL | Status: DC
  Administered 2021-03-27: 0.4 mg via ORAL

## 2021-03-26 MED ORDER — IPRATROPIUM-ALBUTEROL 0.5-2.5 (3) MG/3ML IN SOLN
3.0000 mL | Freq: Four times a day (QID) | RESPIRATORY_TRACT | Status: DC
  Administered 2021-03-26 (×2): 3 mL via RESPIRATORY_TRACT
  Filled 2021-03-26 (×2): qty 3

## 2021-03-26 MED ORDER — MONTELUKAST SODIUM 10 MG PO TABS
10.0000 mg | ORAL_TABLET | Freq: Every day | ORAL | Status: DC
  Administered 2021-03-27: 10 mg via ORAL
  Filled 2021-03-26: qty 1

## 2021-03-26 MED ORDER — GUAIFENESIN ER 600 MG PO TB12: 600.0000 mg | ORAL_TABLET | Freq: Two times a day (BID) | ORAL | Status: DC | PRN

## 2021-03-26 MED ORDER — GLIPIZIDE 5 MG PO TABS: 5.0000 mg | ORAL_TABLET | Freq: Two times a day (BID) | ORAL | Status: DC

## 2021-03-26 MED ORDER — PREDNISONE 20 MG PO TABS
40.0000 mg | ORAL_TABLET | Freq: Two times a day (BID) | ORAL | Status: DC
  Administered 2021-03-26 – 2021-03-27 (×2): 40 mg via ORAL
  Filled 2021-03-26 (×2): qty 2

## 2021-03-26 MED ORDER — HYDROCHLOROTHIAZIDE 25 MG PO TABS
25.0000 mg | ORAL_TABLET | Freq: Every day | ORAL | Status: DC
  Administered 2021-03-26 – 2021-03-27 (×2): 25 mg via ORAL
  Filled 2021-03-26: qty 1

## 2021-03-26 MED ORDER — ONDANSETRON HCL 4 MG PO TABS: 4.0000 mg | ORAL_TABLET | Freq: Four times a day (QID) | ORAL | Status: DC | PRN

## 2021-03-26 MED ORDER — METHYLPREDNISOLONE SODIUM SUCC 125 MG IJ SOLR
125.0000 mg | Freq: Four times a day (QID) | INTRAMUSCULAR | Status: DC
  Administered 2021-03-26: 125 mg via INTRAVENOUS
  Filled 2021-03-26: qty 2

## 2021-03-26 MED ORDER — POTASSIUM CHLORIDE 2 MEQ/ML IV SOLN
INTRAVENOUS | Status: DC
  Filled 2021-03-26 (×3): qty 1000

## 2021-03-26 MED ORDER — SODIUM CHLORIDE 0.9 % IV SOLN: 2.0000 g | INTRAVENOUS | Status: DC

## 2021-03-26 MED ORDER — ALBUTEROL SULFATE (2.5 MG/3ML) 0.083% IN NEBU: 2.5000 mg | INHALATION_SOLUTION | Freq: Four times a day (QID) | RESPIRATORY_TRACT | Status: DC | PRN

## 2021-03-26 MED ORDER — SODIUM CHLORIDE 0.9 % IV SOLN: INTRAVENOUS | Status: DC

## 2021-03-26 NOTE — ED Notes (Signed)
Request made for transport to floor.

## 2021-03-26 NOTE — ED Notes (Signed)
Pt using RR at this time.

## 2021-03-26 NOTE — Progress Notes (Signed)
PHARMACIST - PHYSICIAN COMMUNICATION  CONCERNING:  Enoxaparin (Lovenox) for DVT Prophylaxis    RECOMMENDATION: Patient was prescribed enoxaparin 40mg  q24 hours for VTE prophylaxis.   Filed Weights   03/26/21 0850  Weight: 123.6 kg (272 lb 8 oz)    Body mass index is 41.43 kg/m.  Estimated Creatinine Clearance: 97.1 mL/min (by C-G formula based on SCr of 0.88 mg/dL).   Based on [place] patient is candidate for enoxaparin 0.5mg /kg TBW SQ every 24 hours based on BMI being >30.  DESCRIPTION: Pharmacy has adjusted enoxaparin dose per [facility] policy.  Patient is now receiving enoxaparin 62.5 mg every 24 hours   Benita Gutter 03/26/2021 9:13 AM

## 2021-03-26 NOTE — ED Notes (Signed)
Pt oxyen noted to be 86% on 3L [place] after using RR. O2 increased to 6L [place] with improvement.

## 2021-03-26 NOTE — H&P (Addendum)
History and Physical   Dalton [name] [name] [id number]:[id number] DOB: [birth year] DOA: 03/25/2021  Referring MD/NP/PA: Dr. Jerl [name]  PCP: Pcp, No   Outpatient Specialists: None  Patient coming from: Home  Chief Complaint: Shortness of breath  HPI: Dalton [name] is a 70 y.o. male with medical history significant of COPD, diabetes, hypertension, hyperlipidemia, GERD, chronic respiratory failure on 2 L, sensory neural hearing loss who presented to the ER with progressive shortness of breath cough and wheezing.  Patient was found to be hypoxic.  Oxygen sats on arrival was in the 80s on room air.  He is having productive cough and speaking in short sentences.  Patient was evaluated in the ER.  He meets sepsis criteria and was found to have pneumonia.  At this point he is being admitted with sepsis due to pneumonia.  No sick contacts.  COVID-19 has been negative.  Patient has been immunized.  He has had fever and chills.  Patient was a smoker but no active tobacco now..  ED Course: Temperature 100.4 blood pressure 160/92, pulse 121 respirate 25 oxygen sat 92% on 2 L.  [name] count 16.5 the rest of the CBC within normal.  Sodium is 138 potassium 3.0 chloride 96 the rest of the chemistry within normal except for glucose 274 lactic acid 1.8.  Influenza and COVID-19 screen negative.  Chest x-ray showed bibasilar right greater than left patchy airspace disease.  Findings consistent with pneumonia.  Patient therefore being admitted with sepsis secondary to pneumonia.  Review of Systems: As per HPI otherwise 10 point review of systems negative.    History reviewed. No pertinent past medical history.  History reviewed. No pertinent surgical history.   has no history on file for tobacco use, alcohol use, and drug use.  No Known Allergies  History reviewed. No pertinent family history.   Prior to Admission medications   Not on File    Physical Exam: Vitals:   03/25/21 2253 03/25/21 2330 03/26/21  0000 03/26/21 0049  BP: (!) 155/84 (!) 156/82 138/84 (!) 135/59  Pulse: (!) 115 (!) 114 (!) 110 82  Resp: (!) 24 (!) 22 (!) 25   Temp:    99.5 F (37.5 C)  TempSrc:    Oral  SpO2: 93% 93% 92% 93%      Constitutional: Acutely ill looking in mild distress Vitals:   03/25/21 2253 03/25/21 2330 03/26/21 0000 03/26/21 0049  BP: (!) 155/84 (!) 156/82 138/84 (!) 135/59  Pulse: (!) 115 (!) 114 (!) 110 82  Resp: (!) 24 (!) 22 (!) 25   Temp:    99.5 F (37.5 C)  TempSrc:    Oral  SpO2: 93% 93% 92% 93%   Eyes: PERRL, lids and conjunctivae normal ENMT: Mucous membranes are dry. Posterior pharynx clear of any exudate or lesions.Normal dentition.  Neck: normal, supple, no masses, no thyromegaly Respiratory: Decreased air entry bilaterally with marked expiratory wheezing, rales and crackles, use of extra muscle respiration Cardiovascular: Sinus tachycardia, no murmurs / rubs / gallops. No extremity edema. 2+ pedal pulses. No carotid bruits.  Abdomen: no tenderness, no masses palpated. No hepatosplenomegaly. Bowel sounds positive.  Musculoskeletal: no clubbing / cyanosis. No joint deformity upper and lower extremities. Good ROM, no contractures. Normal muscle tone.  Skin: no rashes, lesions, ulcers. No induration Neurologic: CN 2-12 grossly intact. Sensation intact, DTR normal. Strength 5/5 in all 4.  Psychiatric: Normal judgment and insight. Alert and oriented x 3.  Anxious mood.  Labs on Admission: I have personally reviewed following labs and imaging studies  CBC: Recent Labs  Lab 03/25/21 2236  WBC 16.5*  HGB 15.1  HCT 43.9  MCV 89.6  PLT 814   Basic Metabolic Panel: Recent Labs  Lab 03/25/21 2236  NA 138  K 3.0*  CL 96*  CO2 29  GLUCOSE 274*  BUN 19  CREATININE 1.10  CALCIUM 9.2   GFR: CrCl cannot be calculated (Unknown ideal weight.). Liver Function Tests: No results for input(s): AST, ALT, ALKPHOS, BILITOT, PROT, ALBUMIN in the last 168 hours. No results for  input(s): LIPASE, AMYLASE in the last 168 hours. No results for input(s): AMMONIA in the last 168 hours. Coagulation Profile: No results for input(s): INR, PROTIME in the last 168 hours. Cardiac Enzymes: No results for input(s): CKTOTAL, CKMB, CKMBINDEX, TROPONINI in the last 168 hours. BNP (last 3 results) No results for input(s): PROBNP in the last 8760 hours. HbA1C: No results for input(s): HGBA1C in the last 72 hours. CBG: No results for input(s): GLUCAP in the last 168 hours. Lipid Profile: No results for input(s): CHOL, HDL, LDLCALC, TRIG, CHOLHDL, LDLDIRECT in the last 72 hours. Thyroid Function Tests: No results for input(s): TSH, T4TOTAL, FREET4, T3FREE, THYROIDAB in the last 72 hours. Anemia Panel: No results for input(s): VITAMINB12, FOLATE, FERRITIN, TIBC, IRON, RETICCTPCT in the last 72 hours. Urine analysis: No results found for: COLORURINE, APPEARANCEUR, LABSPEC, PHURINE, GLUCOSEU, HGBUR, BILIRUBINUR, KETONESUR, PROTEINUR, UROBILINOGEN, NITRITE, LEUKOCYTESUR Sepsis Labs: @LABRCNTIP (procalcitonin:4,lacticidven:4) ) Recent Results (from the past 240 hour(s))  Resp Panel by RT-PCR (Flu A&B, Covid) Nasopharyngeal Swab     Status: None   Collection Time: 03/25/21 10:36 PM   Specimen: Nasopharyngeal Swab; Nasopharyngeal(NP) swabs in vial transport medium  Result Value Ref Range Status   SARS Coronavirus 2 by RT PCR NEGATIVE NEGATIVE Final    Comment: (NOTE) SARS-CoV-2 target nucleic acids are NOT DETECTED.  The SARS-CoV-2 RNA is generally detectable in upper respiratory specimens during the acute phase of infection. The lowest concentration of SARS-CoV-2 viral copies this assay can detect is 138 copies/mL. A negative result does not preclude SARS-Cov-2 infection and should not be used as the sole basis for treatment or other patient management decisions. A negative result may occur with  improper specimen collection/handling, submission of specimen other than  nasopharyngeal swab, presence of viral mutation(s) within the areas targeted by this assay, and inadequate number of viral copies(<138 copies/mL). A negative result must be combined with clinical observations, patient history, and epidemiological information. The expected result is Negative.  Fact Sheet for Patients:  EntrepreneurPulse.com.au  Fact Sheet for Healthcare Providers:  IncredibleEmployment.be  This test is no t yet approved or cleared by the [place] FDA and  has been authorized for detection and/or diagnosis of SARS-CoV-2 by FDA under an Emergency Use Authorization (EUA). This EUA will remain  in effect (meaning this test can be used) for the duration of the COVID-19 declaration under Section 564(b)(1) of the Act, 21 U.S.C.section 360bbb-3(b)(1), unless the authorization is terminated  or revoked sooner.       Influenza A by PCR NEGATIVE NEGATIVE Final   Influenza B by PCR NEGATIVE NEGATIVE Final    Comment: (NOTE) The Xpert Xpress SARS-CoV-2/FLU/RSV plus assay is intended as an aid in the diagnosis of influenza from Nasopharyngeal swab specimens and should not be used as a sole basis for treatment. Nasal washings and aspirates are unacceptable for Xpert Xpress SARS-CoV-2/FLU/RSV testing.  Fact Sheet for Patients: EntrepreneurPulse.com.au  Fact Sheet for Healthcare Providers: IncredibleEmployment.be  This test is not yet approved or cleared by the [place] FDA and has been authorized for detection and/or diagnosis of SARS-CoV-2 by FDA under an Emergency Use Authorization (EUA). This EUA will remain in effect (meaning this test can be used) for the duration of the COVID-19 declaration under Section 564(b)(1) of the Act, 21 U.S.C. section 360bbb-3(b)(1), unless the authorization is terminated or revoked.  Performed at [facility], [address], [place], [place]  [address]      Radiological Exams on Admission: DG Chest Portable 1 View  Result Date: 03/25/2021 CLINICAL DATA:  Shortness of breath. EXAM: PORTABLE CHEST 1 VIEW COMPARISON:  Chest x-ray 02/22/2010 FINDINGS: The heart size and mediastinal contours are unchanged. Aortic calcifications. Interval development of right base patchy airspace opacities. Similar left base findings to a lesser extent. No pulmonary edema. No pleural effusion. No pneumothorax. No acute osseous abnormality. IMPRESSION: Bibasilar, right greater than left, patchy airspace opacities could represent infection/inflammation versus atelectasis. Followup PA and lateral chest X-ray is recommended in 3-4 weeks following therapy to ensure resolution and exclude underlying malignancy. Electronically Signed   By: Iven Finn M.D.   On: 03/25/2021 23:05    EKG: Independently reviewed.  Shows sinus tachycardia with bifascicular block.  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  Initiate sepsis protocol with IV Rocephin and Zithromax.  IV fluids, follow lactic acid level.  Monitor closely.  #2 COPD with exacerbation: Continue steroids, antibiotics, and DuoNeb.  #3 GERD: Confirm and initiate PPIs  #4 type 2 diabetes: Blood sugar is likely to get worse with steroids.  Initiate sliding scale insulin  #5 essential hypertension: Med rec is still pending.  Will resume home regimen and  adjust.  #6 chronic kidney disease stage II: Probably at baseline.  Continue to monitor  #7 left adrenal gland adenoma: Chronic.  Continue outpatient follow-up  #8 hearing loss: Chronic finding.  Continue treatment  #9 acute on chronic respiratory failure with hypoxia: Secondary to the pneumonia.  Continue as per #1.  Oxygen as needed.  #10 hyperlipidemia: Confirm home regimen and resume  #11 OSA: CPAP at night    DVT prophylaxis: Lovenox Code Status: Full code Family Communication: No family at bedside Disposition Plan: Home Consults called: None Admission status: Inpatient  Severity of Illness: The appropriate patient status for this patient is INPATIENT. Inpatient status is judged to be reasonable and necessary in order to provide the required intensity of service to ensure the patient's safety. The patient's presenting symptoms, physical exam findings, and initial radiographic and laboratory data in the context of their chronic comorbidities is felt to place them at high risk for further clinical deterioration. Furthermore, it is not anticipated that the patient will be medically stable for discharge from the hospital within 2 midnights of admission. The following factors support the patient status of inpatient.   " The patient's presenting symptoms include shortness of breath  and cough. " The worrisome physical exam findings include coarse breath sounds and expiratory wheezing. " The initial radiographic and laboratory data are worrisome because of chest x-ray confirmed bibasilar pneumonia. " The chronic co-morbidities include COPD and diabetes.   * I certify that at the point of admission it is my clinical judgment that the patient will require inpatient hospital care spanning beyond 2 midnights from the point of admission due to high intensity of service, high risk for further deterioration and high frequency of surveillance required.Barbette Merino MD [facility] Pager [phone]  If 7PM-7AM, please contact night-coverage [email or web address] Password [facility]  03/26/2021, 12:55 AM

## 2021-03-26 NOTE — Progress Notes (Signed)
PROGRESS NOTE  Dalton [name]  DOB: [birth year]  PCP: Kathyrn Lass TDV:[id number]  DOA: 03/25/2021  LOS: 0 days  Hospital Day: 2   Chief Complaint  Patient presents with   Shortness of Breath   Brief narrative: Dalton [name] is a 70 y.o. male with PMH significant for DM2, HTN, HLD, COPD on home oxygen, GERD, hearing loss. Patient presented to the ED on 7/1 with progressive shortness of breath, cough, wheezing  In the ED, patient had a fever of 100.4, oxygen saturation was low in 80s, improved to 92% on 2 L. Labs with WBC count elevated to 16.5, lactic acid 1.8, glucose level elevated to 274. Chest x-ray showed bibasilar right greater than left patchy airspace disease consistent with pneumonia Patient was admitted to hospital service for sepsis secondary to pneumonia  Subjective: Patient was seen and examined this morning.  Pleasant elderly Caucasian male, morbidly obese.  Feels better than at presentation.   On 2 L oxygen nasal cannula.  Assessment/Plan: Sepsis secondary to pneumonia -Presented with shortness of breath, fever, cough, leukocytosis. -chest x-ray consistent with bibasilar pneumonia, right more than left -Started on IV Rocephin and azithromycin. -WBC count slightly better today. -Stop maintenance fluid as he seems to have 1+ bilateral pedal edema Recent Labs  Lab 03/25/21 2236 03/25/21 2336 03/26/21 0247 03/26/21 0704  WBC 16.5*  --   --  15.4*  LATICACIDVEN  --  1.8 1.7  --   PROCALCITON 0.19  --   --  0.74   Acute exacerbation of COPD -Continue steroids with prednisone 40 mg twice daily, DuoNeb  Chronic respiratory failure with hypoxia -On 2 L oxygen at home chronically  Hypokalemia -Potassium level was low at 3, replaced.  3.2 this morning.  Replace more Recent Labs  Lab 03/25/21 2236 03/26/21 0704  K 3.0* 3.2*   Type 2 diabetes mellitus Hyperglycemia -A1c pending -Home meds include metformin 850 mg twice daily, glipizide 5 mg twice  daily, Actos 15 mg daily. -Currently on sliding scale insulin with Accu-Cheks.  -Blood sugar level rising up. Resume glipizide and actos. Keep metformin on hold. No results found for: HGBA1C Recent Labs  Lab 03/26/21 0834 03/26/21 1123  GLUCAP 187* 339*   Essential hypertension Bilateral 1+ pedal edema -Home meds include triamterene 37.5 mg, HCTZ 25 mg daily, Norvasc 10 mg daily, clonidine 0.1 mg twice daily, lisinopril 40 mg daily -Currently blood pressure in 140s without meds.  I would resume HCTZ because of coexisting bilateral pedal edema.  Obtain echocardiogram. -Continue to monitor.  Hyperlipidemia -Continue statin  BPH -Flomax 0.4 mg daily  GERD -PPI  Left adrenal gland adenoma -Continue outpatient follow-up  Morbid obesity  -Body mass index is 41.43 kg/m. Patient has been advised to make an attempt to improve diet and exercise patterns to aid in weight loss.  OSA -CPAP at night  Mobility: Encourage ambulation.  PT eval ordered Code Status:   Code Status: Full Code  Nutritional status: Body mass index is 41.43 kg/m.     Diet:  Diet Order             Diet heart healthy/carb modified Room service appropriate? Yes; Fluid consistency: Thin  Diet effective now                  DVT prophylaxis: Lovenox subcu    Antimicrobials: IV Rocephin, azithromycin Fluid: None Consultants: None Family Communication: None at bedside  Status is: Inpatient  Remains inpatient appropriate because: Needs IV  antibiotics for treatment of pneumonia  Dispo: The patient is from: Home              Anticipated d/c is to: Hopefully home in next 1 to 2 days, pending PT eval              Patient currently is not medically stable to d/c.   Difficult to place patient No     Infusions:   azithromycin Stopped (03/26/21 0148)   cefTRIAXone (ROCEPHIN)  IV Stopped (03/26/21 0043)    Scheduled Meds:  enoxaparin (LOVENOX) injection  0.5 mg/kg Subcutaneous Q24H    hydrochlorothiazide  25 mg Oral Daily   insulin aspart  0-20 Units Subcutaneous TID WC   insulin aspart  0-5 Units Subcutaneous QHS   predniSONE  40 mg Oral BID WC    Antimicrobials: Anti-infectives (From admission, onward)    Start     Dose/Rate Route Frequency Ordered Stop   03/26/21 0500  cefTRIAXone (ROCEPHIN) 2 g in sodium chloride 0.9 % 100 mL IVPB  Status:  Discontinued        2 g 200 mL/hr over 30 Minutes Intravenous Every 24 hours 03/26/21 0448 03/26/21 0528   03/26/21 0500  azithromycin (ZITHROMAX) 500 mg in sodium chloride 0.9 % 250 mL IVPB  Status:  Discontinued        500 mg 250 mL/hr over 60 Minutes Intravenous Every 24 hours 03/26/21 0448 03/26/21 0528   03/25/21 2330  cefTRIAXone (ROCEPHIN) 2 g in sodium chloride 0.9 % 100 mL IVPB        2 g 200 mL/hr over 30 Minutes Intravenous Every 24 hours 03/25/21 2316     03/25/21 2330  azithromycin (ZITHROMAX) 500 mg in sodium chloride 0.9 % 250 mL IVPB        500 mg 250 mL/hr over 60 Minutes Intravenous Every 24 hours 03/25/21 2316         PRN meds: acetaminophen **OR** acetaminophen, albuterol, ondansetron **OR** ondansetron (ZOFRAN) IV   Objective: Vitals:   03/26/21 1210 03/26/21 1314  BP: (!) 141/83 (!) 144/86  Pulse: 88 89  Resp: 17 18  Temp:  98.1 F (36.7 C)  SpO2: 96% 96%   No intake or output data in the 24 hours ending 03/26/21 1458 Filed Weights   03/26/21 0850  Weight: 123.6 kg   Weight change:  Body mass index is 41.43 kg/m.   Physical Exam: General exam: Pleasant, elderly morbidly obese Caucasian male Skin: No rashes, lesions or ulcers. HEENT: Atraumatic, normocephalic, no obvious bleeding Lungs: Clear to auscultation bilaterally on my exam.  No crackles or wheezing. CVS: Regular rate and rhythm, no murmur GI/Abd soft, distended from obesity, nontender, bowel sound present CNS: Alert, awake, oriented x3 Psychiatry: Mood appropriate Extremities: 1+ bilateral pedal edema.  No calf  tenderness  Data Review: I have personally reviewed the laboratory data and studies available.  Recent Labs  Lab 03/25/21 2236 03/26/21 0704  WBC 16.5* 15.4*  HGB 15.1 13.6  HCT 43.9 40.5  MCV 89.6 89.8  PLT 187 188   Recent Labs  Lab 03/25/21 2236 03/26/21 0704  NA 138 139  K 3.0* 3.2*  CL 96* 98  CO2 29 30  GLUCOSE 274* 210*  BUN 19 16  CREATININE 1.10 0.88  CALCIUM 9.2 8.5*    F/u labs ordered Unresulted Labs (From admission, onward)     Start     Ordered   04/02/21 0500  Creatinine, serum  (enoxaparin (LOVENOX)  CrCl >/= 30 ml/min)  Weekly,   STAT     Comments: while on enoxaparin therapy    03/26/21 0448   03/27/21 0500  Procalcitonin  Daily,   STAT      03/26/21 0912   03/27/21 6773  Basic metabolic panel  Daily,   STAT      03/26/21 1106   03/27/21 0500  CBC with Differential/Platelet  Daily,   STAT      03/26/21 1106   03/27/21 0500  Magnesium  Tomorrow morning,   STAT        03/26/21 1106   03/27/21 0500  Phosphorus  Tomorrow morning,   STAT        03/26/21 1106   03/26/21 0448  Hemoglobin A1c  Once,   STAT       Comments: To assess prior glycemic control    03/26/21 0448            Signed, Terrilee Croak, MD [facility] 03/26/2021

## 2021-03-27 LAB — CBC WITH DIFFERENTIAL/PLATELET
Abs Immature Granulocytes: 0.32 10*3/uL — ABNORMAL HIGH (ref 0.00–0.07)
Basophils Absolute: 0 10*3/uL (ref 0.0–0.1)
Basophils Relative: 0 %
Eosinophils Absolute: 0 10*3/uL (ref 0.0–0.5)
Eosinophils Relative: 0 %
HCT: 38.5 % — ABNORMAL LOW (ref 39.0–52.0)
Hemoglobin: 13.1 g/dL (ref 13.0–17.0)
Immature Granulocytes: 2 %
Lymphocytes Relative: 6 %
Lymphs Abs: 0.9 10*3/uL (ref 0.7–4.0)
MCH: 30.3 pg (ref 26.0–34.0)
MCHC: 34 g/dL (ref 30.0–36.0)
MCV: 88.9 fL (ref 80.0–100.0)
Monocytes Absolute: 0.6 10*3/uL (ref 0.1–1.0)
Monocytes Relative: 4 %
Neutro Abs: 12.7 10*3/uL — ABNORMAL HIGH (ref 1.7–7.7)
Neutrophils Relative %: 88 %
Platelets: 192 10*3/uL (ref 150–400)
RBC: 4.33 MIL/uL (ref 4.22–5.81)
RDW: 13.6 % (ref 11.5–15.5)
WBC: 14.5 10*3/uL — ABNORMAL HIGH (ref 4.0–10.5)
nRBC: 0 % (ref 0.0–0.2)

## 2021-03-27 LAB — BASIC METABOLIC PANEL
Anion gap: 9 (ref 5–15)
BUN: 22 mg/dL (ref 8–23)
CO2: 30 mmol/L (ref 22–32)
Calcium: 8.6 mg/dL — ABNORMAL LOW (ref 8.9–10.3)
Chloride: 99 mmol/L (ref 98–111)
Creatinine, Ser: 1.06 mg/dL (ref 0.61–1.24)
GFR, Estimated: >60 mL/min (ref >60)
Glucose, Bld: 239 mg/dL — ABNORMAL HIGH (ref 70–99)
Potassium: 3.5 mmol/L (ref 3.5–5.1)
Sodium: 138 mmol/L (ref 135–145)

## 2021-03-27 LAB — PROCALCITONIN: Procalcitonin: 0.61 ng/mL

## 2021-03-27 LAB — GLUCOSE, CAPILLARY
Glucose-Capillary: 303 mg/dL — ABNORMAL HIGH (ref 70–99)
Glucose-Capillary: 215 mg/dL — ABNORMAL HIGH (ref 70–99)
Glucose-Capillary: 220 mg/dL — ABNORMAL HIGH (ref 70–99)

## 2021-03-27 LAB — PHOSPHORUS: Phosphorus: 2.8 mg/dL (ref 2.5–4.6)

## 2021-03-27 LAB — MAGNESIUM: Magnesium: 1.9 mg/dL (ref 1.7–2.4)

## 2021-03-27 MED ORDER — SIMVASTATIN 20 MG PO TABS: 20.0000 mg | ORAL_TABLET | Freq: Every day | ORAL | Status: DC

## 2021-03-27 MED ORDER — METFORMIN HCL 850 MG PO TABS: 850.0000 mg | ORAL_TABLET | ORAL | Status: DC

## 2021-03-27 MED ORDER — PREDNISONE 10 MG PO TABS: ORAL_TABLET | ORAL | 0 refills | Status: DC

## 2021-03-27 MED ORDER — CEFDINIR 300 MG PO CAPS: 300.0000 mg | ORAL_CAPSULE | Freq: Two times a day (BID) | ORAL | 0 refills | Status: AC

## 2021-03-27 MED ORDER — METFORMIN HCL 850 MG PO TABS
850.0000 mg | ORAL_TABLET | Freq: Two times a day (BID) | ORAL | Status: DC
  Administered 2021-03-27: 850 mg via ORAL
  Filled 2021-03-27 (×2): qty 1

## 2021-03-27 MED ORDER — SACCHAROMYCES BOULARDII 250 MG PO CAPS: 250.0000 mg | ORAL_CAPSULE | Freq: Two times a day (BID) | ORAL | 0 refills | Status: AC

## 2021-03-27 MED ORDER — PANTOPRAZOLE SODIUM 40 MG PO TBEC
40.0000 mg | DELAYED_RELEASE_TABLET | Freq: Every day | ORAL | Status: DC
  Administered 2021-03-27: 40 mg via ORAL
  Filled 2021-03-27: qty 1

## 2021-03-27 NOTE — Discharge Summary (Addendum)
Physician Discharge Summary  Dalton White FAO:[id number] DOB: 1950/12/26 DOA: 03/25/2021  PCP: Pcp, No  Admit date: 03/25/2021 Discharge date: 03/27/2021  Admitted From: Home Discharge disposition: Home   Code Status: Full Code  Diet Recommendation: Cardiac/diabetic diet  Discharge Diagnosis:   Principal Problem:   Sepsis due to pneumonia [facility]) Active Problems:   Benign neoplasm of left adrenal gland   Chronic kidney disease, stage 2 (mild)   COPD (chronic obstructive pulmonary disease) ([place])   GERD (gastroesophageal reflux disease)   HTN (hypertension)   Hypercholesterolemia   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 male with PMH significant for DM2, HTN, HLD, COPD on home oxygen, GERD, hearing loss. Patient presented to the ED on 7/1 with progressive shortness of breath, cough, wheezing  In the ED, patient had a fever of 100.4, oxygen saturation was low in 80s, improved to 92% on 2 L. Labs with WBC count elevated to 16.5, lactic acid 1.8, glucose level elevated to 274. Chest x-ray showed bibasilar right greater than left patchy airspace disease consistent with pneumonia Patient was admitted to hospital service for sepsis secondary to pneumonia  Subjective: Patient was seen and examined this morning.   Propped up in bed.  Not in distress.  On 2 L oxygen which is baseline.  Feels much better.  No cough shortness of breath.  Able to walk to the bathroom by self without any symptoms.  Initially was thinking of discharge tomorrow, however he is concerned that on July 4 he may not be able to get any pharmacy to fill his medicines.  He is not from town and has to depend on his wife to drive him back to his hometown in  [place].  Patient feels ready to go home today.  Hospital course Sepsis secondary to pneumonia -Presented with shortness of breath, fever, cough, leukocytosis. -chest x-ray consistent with bibasilar pneumonia, right more than left -Started on IV Rocephin and azithromycin. -WBC count improving.  Clinically feels better enough to go home. -We will discharge him on 7 more days of oral Omnicef with probiotics. Recent Labs  Lab 03/25/21 2236 03/25/21 2336 03/26/21 0247 03/26/21 0704 03/27/21 0523  WBC 16.5*  --   --  15.4* 14.5*  LATICACIDVEN  --  1.8 1.7  --   --   PROCALCITON 0.19  --   --  0.74 0.61   Acute exacerbation of COPD -Currently on prednisone 40 mg daily.  Taper to finish in next 5 to 7 days.  Continue bronchodilators.  Chronic respiratory failure with hypoxia -On 2 L oxygen at home chronically  Type 2 diabetes mellitus Hyperglycemia -A1c pending -Home meds include metformin 850 mg twice daily, glipizide 5 mg twice daily, Actos 15 mg daily. -Currently on sliding scale insulin with Accu-Cheks.  -Blood sugar level is currently running high in 200s probably because of prednisone.  Discussed with patient about the chance of blood sugar level remaining elevated while on prednisone for next few days.  He understands and will monitor blood sugar at home.  He does not traditionally use insulin and does not think he would need insulin. No results found for: HGBA1C Recent Labs  Lab 03/26/21 0834 03/26/21 1123 03/26/21 1635 03/27/21 0102  03/27/21 0738  GLUCAP 187* 339* 282* 303* 215*   Essential hypertension Bilateral 1+ pedal edema -Home meds include Maxzide 37.5 mg/25 mg daily, Norvasc 10 mg daily, clonidine 0.1 mg twice daily, lisinopril 40 mg daily -Currently blood pressure is normal in 130s with Maxzide only.  Triamterene, Norvasc, clonidine and lisinopril remain on hold.  I have suggested him to continue to monitor his blood pressure at home.  If blood pressure  exceeds 140/90, he can resume one medicine at a time.  Hyperlipidemia -Continue statin  BPH -Flomax 0.4 mg daily  GERD -PPI  Left adrenal gland adenoma -Continue outpatient follow-up  Morbid obesity  -Body mass index is 43.24 kg/m. Patient has been advised to make an attempt to improve diet and exercise patterns to aid in weight loss.  OSA -CPAP at night   Wound care:    Discharge Exam:   Vitals:   03/26/21 2238 03/26/21 2334 03/27/21 0600 03/27/21 0750  BP: (!) 145/79 137/71 133/72 135/72  Pulse: 85 89 78 80  Resp: 17 19 19 18   Temp:  98.9 F (37.2 C) 97.8 F (36.6 C) 97.6 F (36.4 C)  TempSrc:    Oral  SpO2: 98% 96% 93% 95%  Weight:  129 kg    Height:  5\' 8"  (1.727 m)      Body mass index is 43.24 kg/m.  General exam: Pleasant elderly Caucasian male, morbidly obese.  Not in distress Skin: No rashes, lesions or ulcers. HEENT: Atraumatic, normocephalic, no obvious bleeding Lungs: Diminished air entry on both bases, otherwise clear to auscultation bilaterally.  No crackles or wheezing. CVS: Regular rate and rhythm, no murmur GI/Abd soft, distended from obesity, nontender, bowel sound present CNS: Alert, awake, oriented x3 Psychiatry: Mood appropriate Extremities: Pedal edema trace bilaterally, improving  Follow ups:   Discharge Instructions     Diet - low sodium heart healthy   Complete by: As directed    Diet Carb Modified   Complete by: As directed    Increase activity slowly   Complete by: As directed        Follow-up [place] [facility] up.   Contact information: [address] [place] [address] [phone]                Recommendations for Outpatient Follow-Up:   Follow-up with PCP as an outpatient  Discharge Instructions:  Follow with Primary MD Pcp, No in 7 days   Get CBC/BMP checked in next visit within 1 week by PCP or SNF MD ( we routinely change or  add medications that can affect your baseline labs and fluid status, therefore we recommend that you get the mentioned basic workup next visit with your PCP, your PCP may decide not to get them or add new tests based on their clinical decision)  On your next visit with your PCP, please Get Medicines reviewed and adjusted.  Please request your PCP  to go over all Hospital Tests and Procedure/Radiological results at the follow up, please get all Hospital records sent to your Prim MD by signing hospital release before you go home.  Activity: As tolerated with Full fall precautions use walker/cane & assistance as needed  For Heart failure patients - Check your Weight same time everyday, if you gain over 2 pounds, or you develop in leg swelling, experience more shortness of breath or chest pain, call your Primary MD immediately. Follow Cardiac Low Salt Diet and  1.5 lit/day fluid restriction.  If you have smoked or chewed Tobacco in the last 2 yrs please stop smoking, stop any regular Alcohol  and or any Recreational drug use.  If you experience worsening of your admission symptoms, develop shortness of breath, life threatening emergency, suicidal or homicidal thoughts you must seek medical attention immediately by calling 911 or calling your MD immediately  if symptoms less severe.  You Must read complete instructions/literature along with all the possible adverse reactions/side effects for all the Medicines you take and that have been prescribed to you. Take any new Medicines after you have completely understood and accpet all the possible adverse reactions/side effects.   Do not drive, operate heavy machinery, perform activities at heights, swimming or participation in water activities or provide baby sitting services if your were admitted for syncope or siezures until you have seen by Primary MD or a Neurologist and advised to do so again.  Do not drive when taking Pain medications.  Do not take more  than prescribed Pain, Sleep and Anxiety Medications  Wear Seat belts while driving.   Please note You were cared for by a hospitalist during your hospital stay. If you have any questions about your discharge medications or the care you received while you were in the hospital after you are discharged, you can call the unit and asked to speak with the hospitalist on call if the hospitalist that took care of you is not available. Once you are discharged, your primary care physician will handle any further medical issues. Please note that NO REFILLS for any discharge medications will be authorized once you are discharged, as it is imperative that you return to your primary care physician (or establish a relationship with a primary care physician if you do not have one) for your aftercare needs so that they can reassess your need for medications and monitor your lab values.    Time coordinating discharge: 35 minutes  Allergies as of 03/27/2021   No Known Allergies      Medication List     STOP taking these medications    amLODipine 10 MG tablet Commonly known as: NORVASC   cloNIDine 0.1 MG tablet Commonly known as: CATAPRES   lisinopril 40 MG tablet Commonly known as: ZESTRIL       TAKE these medications    albuterol 108 (90 Base) MCG/ACT inhaler Commonly known as: VENTOLIN HFA Inhale 2 puffs into the lungs every 4 (four) hours as needed.   azelastine 0.1 % nasal spray Commonly known as: ASTELIN Place 1 spray into both nostrils 2 (two) times daily.   cefdinir 300 MG capsule Commonly known as: OMNICEF Take 1 capsule (300 mg total) by mouth 2 (two) times daily for 7 days.   ferrous sulfate 325 (65 FE) MG tablet Take 325 mg by mouth 2 (two) times daily with a meal.   fluticasone 50 MCG/ACT nasal spray Commonly known as: FLONASE Place 1-2 sprays into both nostrils daily.   fluticasone-salmeterol 500-50 MCG/ACT Aepb Commonly known as: ADVAIR Inhale 1 puff into the lungs 2  (two) times daily.   glipiZIDE 5 MG tablet Commonly known as: GLUCOTROL Take 5-10 tablets by mouth See admin instructions. Take 2 tablets (10mg ) by mouth every morning and take 1 tablet (5mg ) by mouth every evening   guaifenesin 400 MG Tabs tablet Commonly known as: HUMIBID E Take 400-800 mg by mouth every 6 (six) hours as needed (congestion or phlegm).   ipratropium 0.03 % nasal spray  Commonly known as: ATROVENT Place 2 sprays into both nostrils in the morning, at noon, and at bedtime.   ipratropium-albuterol 0.5-2.5 (3) MG/3ML Soln Commonly known as: DUONEB Take 3 mLs by nebulization every 4 (four) hours as needed.   loratadine 10 MG tablet Commonly known as: CLARITIN Take 10 mg by mouth daily.   metFORMIN 850 MG tablet Commonly known as: GLUCOPHAGE Take 850-1,700 mg by mouth See admin instructions. Take 2 tablets (1700mg ) by mouth every morning and take 1 tablet (850mg ) by mouth every evening   montelukast 10 MG tablet Commonly known as: SINGULAIR Take 10 mg by mouth at bedtime.   Omega 3 500 500 MG Caps Take 500 mg by mouth daily.   pantoprazole 40 MG tablet Commonly known as: PROTONIX Take 40 mg by mouth daily.   pioglitazone 15 MG tablet Commonly known as: ACTOS Take 15 mg by mouth daily.   Potassium 99 MG Tabs Take 99 mg by mouth 2 (two) times daily.   predniSONE 10 MG tablet Commonly known as: DELTASONE Take 4 tablets daily X 2 days, then, Take 3 tablets daily X 2 days, then, Take 2 tablets daily X 2 days, then, Take 1 tablets daily X 1 day.   saccharomyces boulardii 250 MG capsule Commonly known as: FLORASTOR Take 1 capsule (250 mg total) by mouth 2 (two) times daily for 10 days.   simvastatin 20 MG tablet Commonly known as: ZOCOR Take 20 mg by mouth daily.   tamsulosin 0.4 MG Caps capsule Commonly known as: FLOMAX Take 0.4 mg by mouth at bedtime.   Tiotropium Bromide Monohydrate 2.5 MCG/ACT Aers Inhale 2 puffs into the lungs daily.    triamterene-hydrochlorothiazide 37.5-25 MG tablet Commonly known as: MAXZIDE-25 Take 1 tablet by mouth daily.        The results of significant diagnostics from this hospitalization (including imaging, microbiology, ancillary and laboratory) are listed below for reference.    Procedures and Diagnostic Studies:   DG Chest Portable 1 View  Result Date: 03/25/2021 CLINICAL DATA:  Shortness of breath. EXAM: PORTABLE CHEST 1 VIEW COMPARISON:  Chest x-ray 02/22/2010 FINDINGS: The heart size and mediastinal contours are unchanged. Aortic calcifications. Interval development of right base patchy airspace opacities. Similar left base findings to a lesser extent. No pulmonary edema. No pleural effusion. No pneumothorax. No acute osseous abnormality. IMPRESSION: Bibasilar, right greater than left, patchy airspace opacities could represent infection/inflammation versus atelectasis. Followup PA and lateral chest X-ray is recommended in 3-4 weeks following therapy to ensure resolution and exclude underlying malignancy. Electronically Signed   By: Iven Finn M.D.   On: 03/25/2021 23:05     Labs:   Basic Metabolic Panel: Recent Labs  Lab 03/25/21 2236 03/26/21 0704 03/27/21 0523  NA 138 139 138  K 3.0* 3.2* 3.5  CL 96* 98 99  CO2 29 30 30   GLUCOSE 274* 210* 239*  BUN 19 16 22   CREATININE 1.10 0.88 1.06  CALCIUM 9.2 8.5* 8.6*  MG  --   --  1.9  PHOS  --   --  2.8   GFR Estimated Creatinine Clearance: 82.5 mL/min (by C-G formula based on SCr of 1.06 mg/dL). Liver Function Tests: Recent Labs  Lab 03/26/21 0704  AST 20  ALT 21  ALKPHOS 53  BILITOT 0.9  PROT 6.0*  ALBUMIN 3.1*   No results for input(s): LIPASE, AMYLASE in the last 168 hours. No results for input(s): AMMONIA in the last 168 hours. Coagulation profile Recent Labs  Lab  03/26/21 0704  INR 1.1    CBC: Recent Labs  Lab 03/25/21 2236 03/26/21 0704 03/27/21 0523  WBC 16.5* 15.4* 14.5*  NEUTROABS  --   --   12.7*  HGB 15.1 13.6 13.1  HCT 43.9 40.5 38.5*  MCV 89.6 89.8 88.9  PLT 187 188 192   Cardiac Enzymes: No results for input(s): CKTOTAL, CKMB, CKMBINDEX, TROPONINI in the last 168 hours. BNP: Invalid input(s): POCBNP CBG: Recent Labs  Lab 03/26/21 0834 03/26/21 1123 03/26/21 1635 03/27/21 0102 03/27/21 0738  GLUCAP 187* 339* 282* 303* 215*   D-Dimer No results for input(s): DDIMER in the last 72 hours. Hgb A1c No results for input(s): HGBA1C in the last 72 hours. Lipid Profile No results for input(s): CHOL, HDL, LDLCALC, TRIG, CHOLHDL, LDLDIRECT in the last 72 hours. Thyroid function studies No results for input(s): TSH, T4TOTAL, T3FREE, THYROIDAB in the last 72 hours.  Invalid input(s): FREET3 Anemia work up No results for input(s): VITAMINB12, FOLATE, FERRITIN, TIBC, IRON, RETICCTPCT in the last 72 hours. Microbiology Recent Results (from the past 240 hour(s))  Culture, blood (routine x 2)     Status: None (Preliminary result)   Collection Time: 03/25/21 10:36 PM   Specimen: BLOOD  Result Value Ref Range Status   Specimen Description BLOOD BLOOD LEFT FOREARM  Final   Special Requests   Final    BOTTLES DRAWN AEROBIC AND ANAEROBIC Blood Culture adequate volume   Culture   Final    NO GROWTH 1 DAY Performed at [facility], [address], [place], [place] [address]    Report Status PENDING  Incomplete  Resp Panel by RT-PCR (Flu A&B, Covid) Nasopharyngeal Swab     Status: None   Collection Time: 03/25/21 10:36 PM   Specimen: Nasopharyngeal Swab; Nasopharyngeal(NP) swabs in vial transport medium  Result Value Ref Range Status   SARS Coronavirus 2 by RT PCR NEGATIVE NEGATIVE Final    Comment: (NOTE) SARS-CoV-2 target nucleic acids are NOT DETECTED.  The SARS-CoV-2 RNA is generally detectable in upper respiratory specimens during the acute phase of infection. The lowest concentration of SARS-CoV-2 viral copies this assay can detect is 138 copies/mL. A  negative result does not preclude SARS-Cov-2 infection and should not be used as the sole basis for treatment or other patient management decisions. A negative result may occur with  improper specimen collection/handling, submission of specimen other than nasopharyngeal swab, presence of viral mutation(s) within the areas targeted by this assay, and inadequate number of viral copies(<138 copies/mL). A negative result must be combined with clinical observations, patient history, and epidemiological information. The expected result is Negative.  Fact Sheet for Patients:  EntrepreneurPulse.com.au  Fact Sheet for Healthcare Providers:  IncredibleEmployment.be  This test is no t yet approved or cleared by the [place] FDA and  has been authorized for detection and/or diagnosis of SARS-CoV-2 by FDA under an Emergency Use Authorization (EUA). This EUA will remain  in effect (meaning this test can be used) for the duration of the COVID-19 declaration under Section 564(b)(1) of the Act, 21 U.S.C.section 360bbb-3(b)(1), unless the authorization is terminated  or revoked sooner.       Influenza A by PCR NEGATIVE NEGATIVE Final   Influenza B by PCR NEGATIVE NEGATIVE Final    Comment: (NOTE) The Xpert Xpress SARS-CoV-2/FLU/RSV plus assay is intended as an aid in the diagnosis of influenza from Nasopharyngeal swab specimens and should not be used as a sole basis for treatment. Nasal washings and aspirates  are unacceptable for Xpert Xpress SARS-CoV-2/FLU/RSV testing.  Fact Sheet for Patients: EntrepreneurPulse.com.au  Fact Sheet for Healthcare Providers: IncredibleEmployment.be  This test is not yet approved or cleared by the [place] FDA and has been authorized for detection and/or diagnosis of SARS-CoV-2 by FDA under an Emergency Use Authorization (EUA). This EUA will remain in effect (meaning this test can  be used) for the duration of the COVID-19 declaration under Section 564(b)(1) of the Act, 21 U.S.C. section 360bbb-3(b)(1), unless the authorization is terminated or revoked.  Performed at [facility], [address] [place], [place] [address]   Culture, blood (routine x 2)     Status: None (Preliminary result)   Collection Time: 03/25/21 11:36 PM   Specimen: BLOOD  Result Value Ref Range Status   Specimen Description BLOOD RIGHT ANTECUBITAL  Final   Special Requests   Final    BOTTLES DRAWN AEROBIC AND ANAEROBIC Blood Culture results may not be optimal due to an excessive volume of blood received in culture bottles   Culture   Final    NO GROWTH 1 DAY Performed at [facility], [address], [place], [place] [address]    Report Status PENDING  Incomplete     Signed: Terrilee Croak  [facility] 03/27/2021, 10:11 AM

## 2021-03-27 NOTE — Plan of Care (Signed)
Pt admitted for sepsis r/t pneumonia. A&OX4, no respiratory distress noted. BP 137/71 (BP Location: Right Arm)   Pulse 89   Temp 98.9 F (37.2 C)   Resp 19   Ht 5\' 8"  (1.727 m)   Wt 123.6 kg   SpO2 96%   BMI 41.43 kg/m  Pt oriented to room. Pt had small amount of blood on left pinky toe. Cleaned and band aid put on. Pt blood sugar 303, pt placed on CPAP at HS. IV antibiotics started. No other needs voiced at this time. Pt oriented to room. Bed in lowest position, bed alarm on, 3/4 bed rails up and call bell near by. Hal Neer.

## 2021-03-27 NOTE — Plan of Care (Signed)

## 2021-03-27 NOTE — Progress Notes (Signed)
PT Cancellation Note  Patient Details Name: Dalton White MRN: [id number] DOB: [birth year]   Cancelled Treatment:    Reason Eval/Treat Not Completed: PT screened, no needs identified, will sign off PT orders received, chart reviewed. Spoke with nurse who reports pt ambulated to bathroom without assistance nor AD this morning, and spoke with pt who feels he is at his baseline in regards to strength & mobility. PT to sign off at this time, please re-consult if new needs arise.  Dalton White, PT, DPT 03/27/21, 9:29 AM    Dalton White 03/27/2021, 9:28 AM

## 2021-03-29 LAB — HEMOGLOBIN A1C
Hgb A1c MFr Bld: 7.3 % — ABNORMAL HIGH (ref 4.8–5.6)
Hgb A1c MFr Bld: 7.3 % — ABNORMAL HIGH (ref 4.8–5.6)
Mean Plasma Glucose: 163 mg/dL
Mean Plasma Glucose: 163 mg/dL

## 2021-03-31 LAB — CULTURE, BLOOD (ROUTINE X 2)
Culture: NO GROWTH
Culture: NO GROWTH
Special Requests: ADEQUATE

## 2022-01-23 DIAGNOSIS — C884 Extranodal marginal zone b-cell lymphoma of mucosa-associated lymphoid tissue (malt-lymphoma) not having achieved remission: Secondary | ICD-10-CM | POA: Diagnosis present

## 2022-08-22 IMAGING — DX DG CHEST 1V PORT
2 series · 2 of 2 positions shown · non-contrast
Comparison: Chest x-ray 02/22/2010

CLINICAL DATA: Shortness of breath.

EXAM:
PORTABLE CHEST 1 VIEW

[chest ap (1 of 2)]
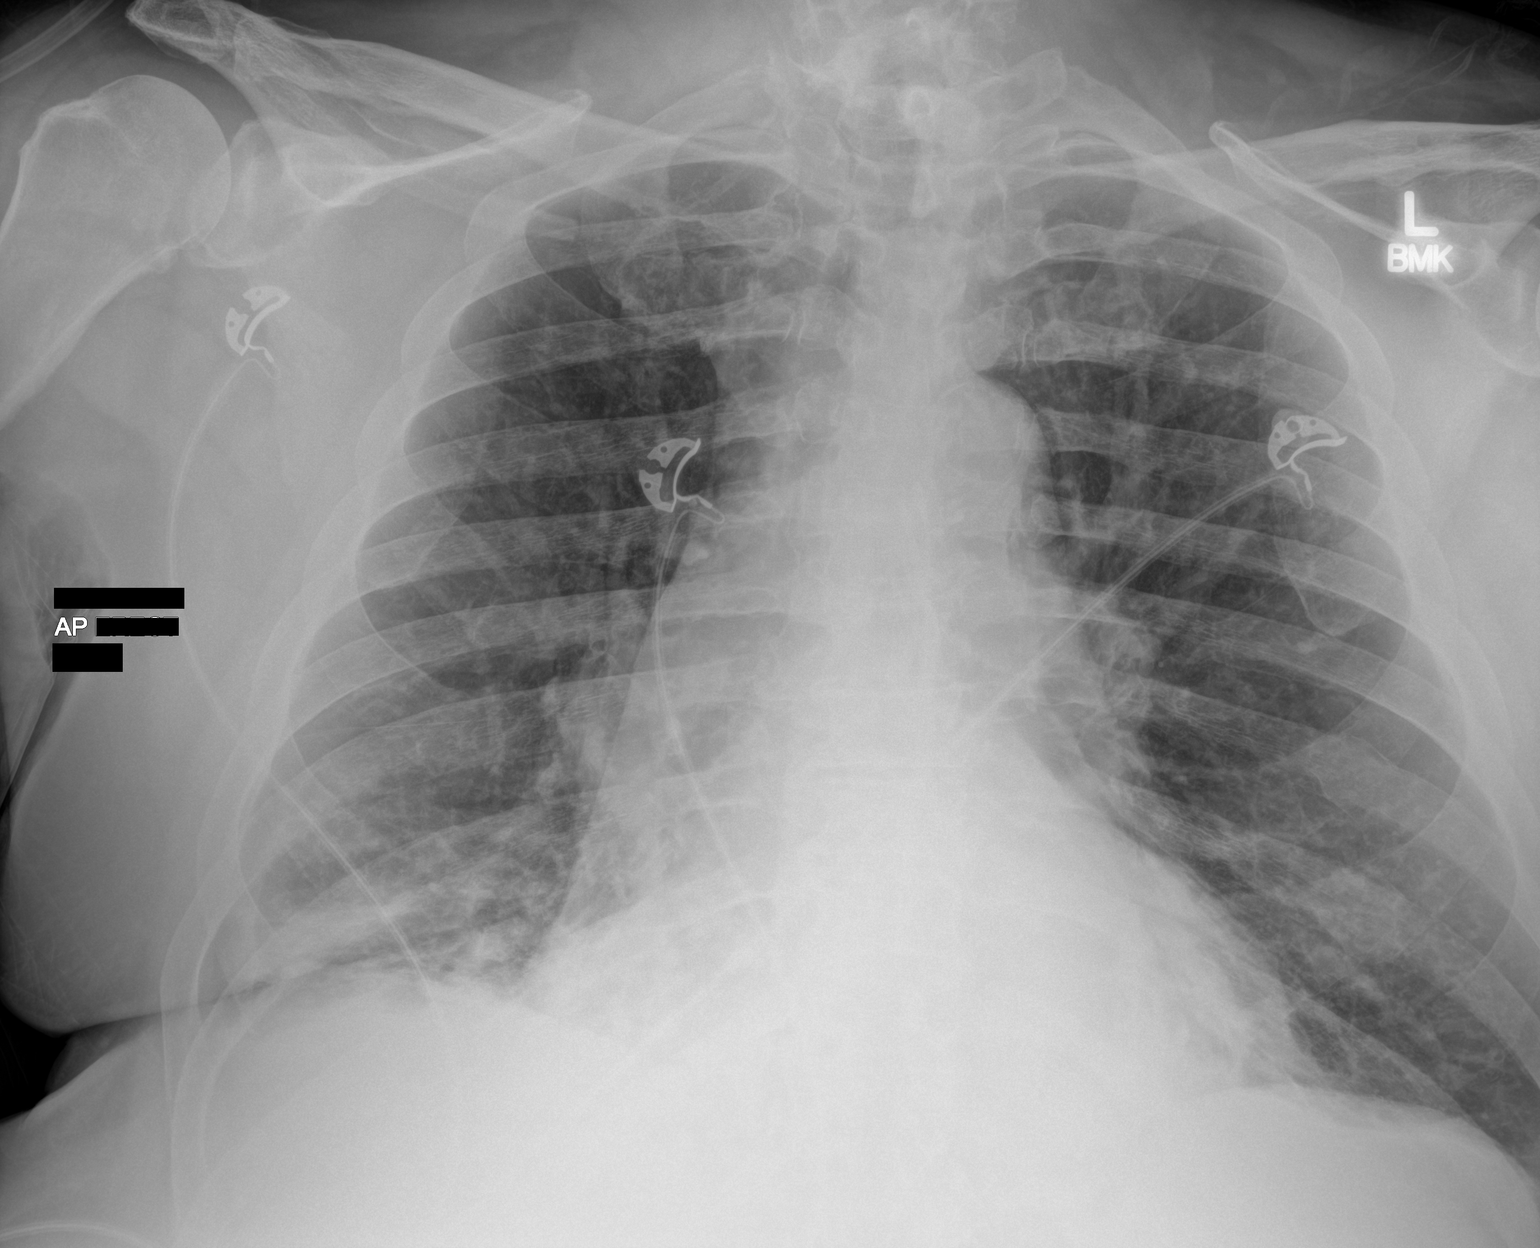

[chest ap (2 of 2)]
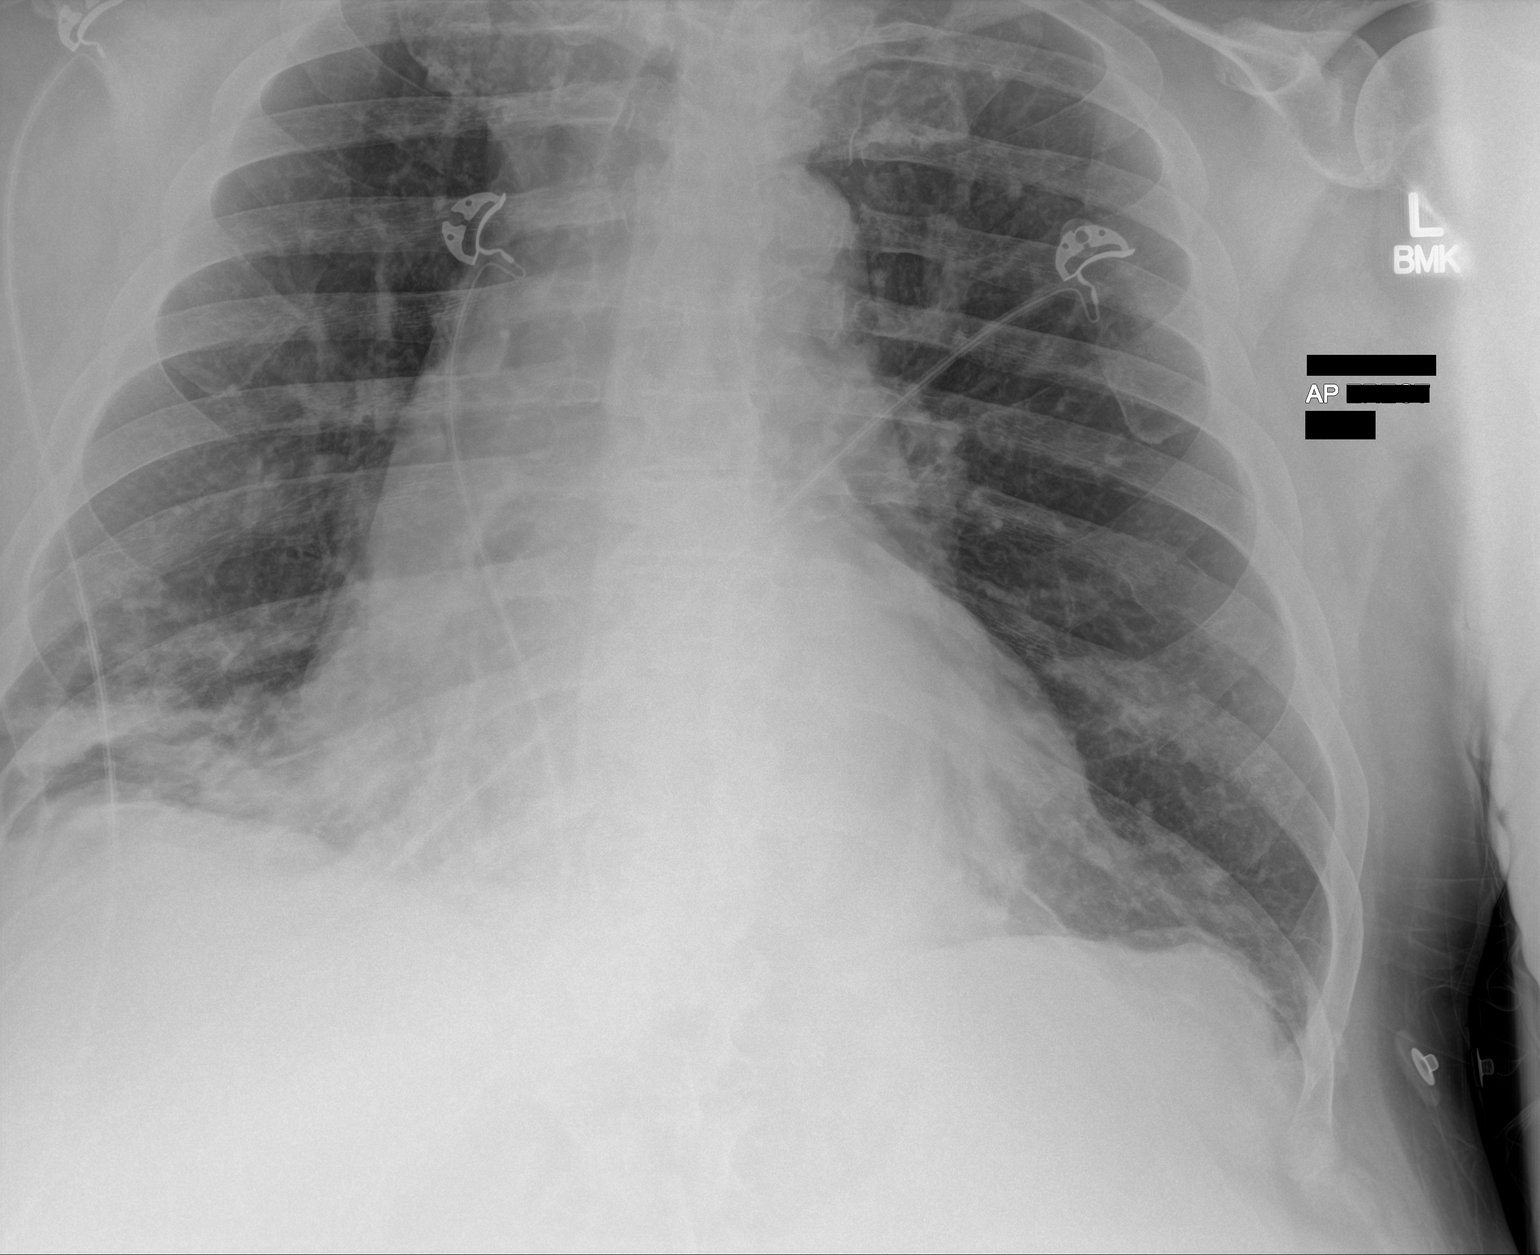

[2 of 2 positions shown; findings below may reference images not displayed]

FINDINGS: The heart size and mediastinal contours are unchanged. Aortic
calcifications.

Interval development of right base patchy airspace opacities.
Similar left base findings to a lesser extent. No pulmonary edema.
No pleural effusion. No pneumothorax.

No acute osseous abnormality.
IMPRESSION: Bibasilar, right greater than left, patchy airspace opacities could
represent infection/inflammation versus atelectasis. Followup PA and
lateral chest X-ray is recommended in 3-4 weeks following therapy to
ensure resolution and exclude underlying malignancy.

## 2022-12-25 ENCOUNTER — Other Ambulatory Visit: Payer: Self-pay

## 2022-12-25 ENCOUNTER — Encounter: Payer: Self-pay | Admitting: Emergency Medicine

## 2022-12-25 ENCOUNTER — Emergency Department: Payer: Medicare Other

## 2022-12-25 ENCOUNTER — Inpatient Hospital Stay
Admission: EM | Admit: 2022-12-25 | Discharge: 2022-12-28 | DRG: 872 | Disposition: A | Discharge status: Home / Self Care | Payer: Medicare Other | Attending: Internal Medicine | Admitting: Internal Medicine

## 2022-12-25 DIAGNOSIS — R3911 Hesitancy of micturition: Secondary | ICD-10-CM | POA: Diagnosis present

## 2022-12-25 DIAGNOSIS — R310 Gross hematuria: Secondary | ICD-10-CM | POA: Diagnosis present

## 2022-12-25 DIAGNOSIS — R197 Diarrhea, unspecified: Secondary | ICD-10-CM | POA: Insufficient documentation

## 2022-12-25 DIAGNOSIS — J9611 Chronic respiratory failure with hypoxia: Secondary | ICD-10-CM | POA: Diagnosis not present

## 2022-12-25 DIAGNOSIS — I10 Essential (primary) hypertension: Secondary | ICD-10-CM | POA: Diagnosis present

## 2022-12-25 DIAGNOSIS — N390 Urinary tract infection, site not specified: Secondary | ICD-10-CM | POA: Diagnosis present

## 2022-12-25 DIAGNOSIS — Z7984 Long term (current) use of oral hypoglycemic drugs: Secondary | ICD-10-CM

## 2022-12-25 DIAGNOSIS — Z9981 Dependence on supplemental oxygen: Secondary | ICD-10-CM

## 2022-12-25 DIAGNOSIS — A4151 Sepsis due to Escherichia coli [E. coli]: Principal | ICD-10-CM | POA: Diagnosis present

## 2022-12-25 DIAGNOSIS — R3914 Feeling of incomplete bladder emptying: Secondary | ICD-10-CM | POA: Diagnosis present

## 2022-12-25 DIAGNOSIS — I441 Atrioventricular block, second degree: Secondary | ICD-10-CM | POA: Diagnosis present

## 2022-12-25 DIAGNOSIS — E1149 Type 2 diabetes mellitus with other diabetic neurological complication: Secondary | ICD-10-CM | POA: Diagnosis present

## 2022-12-25 DIAGNOSIS — A419 Sepsis, unspecified organism: Secondary | ICD-10-CM | POA: Diagnosis present

## 2022-12-25 DIAGNOSIS — R600 Localized edema: Secondary | ICD-10-CM | POA: Diagnosis not present

## 2022-12-25 DIAGNOSIS — Z7951 Long term (current) use of inhaled steroids: Secondary | ICD-10-CM | POA: Diagnosis not present

## 2022-12-25 DIAGNOSIS — R350 Frequency of micturition: Secondary | ICD-10-CM | POA: Diagnosis present

## 2022-12-25 DIAGNOSIS — Z87442 Personal history of urinary calculi: Secondary | ICD-10-CM | POA: Diagnosis not present

## 2022-12-25 DIAGNOSIS — C884 Extranodal marginal zone b-cell lymphoma of mucosa-associated lymphoid tissue (malt-lymphoma) not having achieved remission: Secondary | ICD-10-CM | POA: Diagnosis present

## 2022-12-25 DIAGNOSIS — J449 Chronic obstructive pulmonary disease, unspecified: Secondary | ICD-10-CM | POA: Diagnosis present

## 2022-12-25 DIAGNOSIS — B9620 Unspecified Escherichia coli [E. coli] as the cause of diseases classified elsewhere: Secondary | ICD-10-CM | POA: Diagnosis present

## 2022-12-25 DIAGNOSIS — Z79899 Other long term (current) drug therapy: Secondary | ICD-10-CM | POA: Diagnosis not present

## 2022-12-25 DIAGNOSIS — Z87891 Personal history of nicotine dependence: Secondary | ICD-10-CM

## 2022-12-25 DIAGNOSIS — G4733 Obstructive sleep apnea (adult) (pediatric): Secondary | ICD-10-CM | POA: Diagnosis present

## 2022-12-25 DIAGNOSIS — Z7982 Long term (current) use of aspirin: Secondary | ICD-10-CM

## 2022-12-25 DIAGNOSIS — N3001 Acute cystitis with hematuria: Secondary | ICD-10-CM | POA: Diagnosis not present

## 2022-12-25 DIAGNOSIS — N401 Enlarged prostate with lower urinary tract symptoms: Secondary | ICD-10-CM | POA: Diagnosis present

## 2022-12-25 DIAGNOSIS — Z888 Allergy status to other drugs, medicaments and biological substances status: Secondary | ICD-10-CM

## 2022-12-25 DIAGNOSIS — I4891 Unspecified atrial fibrillation: Secondary | ICD-10-CM | POA: Diagnosis present

## 2022-12-25 LAB — BASIC METABOLIC PANEL
Anion gap: 10 (ref 5–15)
BUN: 29 mg/dL — ABNORMAL HIGH (ref 8–23)
CO2: 31 mmol/L (ref 22–32)
Calcium: 9.4 mg/dL (ref 8.9–10.3)
Chloride: 98 mmol/L (ref 98–111)
Creatinine, Ser: 1.28 mg/dL — ABNORMAL HIGH (ref 0.61–1.24)
GFR, Estimated: 59 mL/min — ABNORMAL LOW (ref >60)
Glucose, Bld: 82 mg/dL (ref 70–99)
Potassium: 3.4 mmol/L — ABNORMAL LOW (ref 3.5–5.1)
Sodium: 139 mmol/L (ref 135–145)

## 2022-12-25 LAB — CBC WITH DIFFERENTIAL/PLATELET
Abs Immature Granulocytes: 0.07 10*3/uL (ref 0.00–0.07)
Basophils Absolute: 0.1 10*3/uL (ref 0.0–0.1)
Basophils Relative: 1 %
Eosinophils Absolute: 0.2 10*3/uL (ref 0.0–0.5)
Eosinophils Relative: 1 %
HCT: 50.6 % (ref 39.0–52.0)
Hemoglobin: 16.7 g/dL (ref 13.0–17.0)
Immature Granulocytes: 1 %
Lymphocytes Relative: 9 %
Lymphs Abs: 1.2 10*3/uL (ref 0.7–4.0)
MCH: 29.9 pg (ref 26.0–34.0)
MCHC: 33 g/dL (ref 30.0–36.0)
MCV: 90.7 fL (ref 80.0–100.0)
Monocytes Absolute: 0.7 10*3/uL (ref 0.1–1.0)
Monocytes Relative: 5 %
Neutro Abs: 10.9 10*3/uL — ABNORMAL HIGH (ref 1.7–7.7)
Neutrophils Relative %: 83 %
Platelets: 199 10*3/uL (ref 150–400)
RBC: 5.58 MIL/uL (ref 4.22–5.81)
RDW: 14 % (ref 11.5–15.5)
WBC: 13.1 10*3/uL — ABNORMAL HIGH (ref 4.0–10.5)
nRBC: 0 % (ref 0.0–0.2)

## 2022-12-25 LAB — URINALYSIS, ROUTINE W REFLEX MICROSCOPIC
Bacteria, UA: NONE SEEN
RBC / HPF: >50 RBC/hpf (ref 0–5)
Specific Gravity, Urine: 1.017 (ref 1.005–1.030)
Squamous Epithelial / HPF: NONE SEEN /HPF (ref 0–5)

## 2022-12-25 LAB — CBG MONITORING, ED
Glucose-Capillary: 193 mg/dL — ABNORMAL HIGH (ref 70–99)
Glucose-Capillary: 125 mg/dL — ABNORMAL HIGH (ref 70–99)
Glucose-Capillary: 97 mg/dL (ref 70–99)

## 2022-12-25 LAB — GLUCOSE, CAPILLARY
Glucose-Capillary: 85 mg/dL (ref 70–99)
Glucose-Capillary: 196 mg/dL — ABNORMAL HIGH (ref 70–99)

## 2022-12-25 LAB — LACTIC ACID, PLASMA: Lactic Acid, Venous: 1.7 mmol/L (ref 0.5–1.9)

## 2022-12-25 LAB — PROCALCITONIN: Procalcitonin: <0.1 ng/mL

## 2022-12-25 MED ORDER — TAMSULOSIN HCL 0.4 MG PO CAPS
0.4000 mg | ORAL_CAPSULE | Freq: Every day | ORAL | Status: DC
  Administered 2022-12-25 – 2022-12-27 (×3): 0.4 mg via ORAL
  Filled 2022-12-25 (×3): qty 1

## 2022-12-25 MED ORDER — IPRATROPIUM-ALBUTEROL 0.5-2.5 (3) MG/3ML IN SOLN: 3.0000 mL | RESPIRATORY_TRACT | Status: DC | PRN

## 2022-12-25 MED ORDER — MORPHINE SULFATE (PF) 4 MG/ML IV SOLN
4.0000 mg | Freq: Once | INTRAVENOUS | Status: AC
  Administered 2022-12-25: 4 mg via INTRAVENOUS
  Filled 2022-12-25: qty 1

## 2022-12-25 MED ORDER — ONDANSETRON HCL 4 MG/2ML IJ SOLN: 4.0000 mg | Freq: Four times a day (QID) | INTRAMUSCULAR | Status: DC | PRN

## 2022-12-25 MED ORDER — SIMVASTATIN 20 MG PO TABS
20.0000 mg | ORAL_TABLET | Freq: Every day | ORAL | Status: DC
  Administered 2022-12-25 – 2022-12-28 (×4): 20 mg via ORAL
  Filled 2022-12-25: qty 2
  Filled 2022-12-25 (×3): qty 1

## 2022-12-25 MED ORDER — SODIUM CHLORIDE 0.9 % IR SOLN
3000.0000 mL | Status: DC
  Administered 2022-12-25 (×2): 3000 mL

## 2022-12-25 MED ORDER — INSULIN ASPART 100 UNIT/ML IJ SOLN
0.0000 [IU] | INTRAMUSCULAR | Status: DC
  Administered 2022-12-25 (×2): 3 [IU] via SUBCUTANEOUS
  Administered 2022-12-25 – 2022-12-26 (×2): 2 [IU] via SUBCUTANEOUS
  Administered 2022-12-26: 8 [IU] via SUBCUTANEOUS
  Administered 2022-12-26: 3 [IU] via SUBCUTANEOUS
  Administered 2022-12-26: 2 [IU] via SUBCUTANEOUS
  Administered 2022-12-26: 3 [IU] via SUBCUTANEOUS
  Administered 2022-12-26: 8 [IU] via SUBCUTANEOUS
  Administered 2022-12-27: 5 [IU] via SUBCUTANEOUS
  Administered 2022-12-27: 3 [IU] via SUBCUTANEOUS
  Administered 2022-12-27 (×2): 2 [IU] via SUBCUTANEOUS
  Administered 2022-12-27: 5 [IU] via SUBCUTANEOUS
  Administered 2022-12-27: 3 [IU] via SUBCUTANEOUS
  Administered 2022-12-28: 5 [IU] via SUBCUTANEOUS
  Administered 2022-12-28 (×2): 2 [IU] via SUBCUTANEOUS
  Filled 2022-12-25 (×18): qty 1

## 2022-12-25 MED ORDER — ACETAMINOPHEN 325 MG PO TABS: 650.0000 mg | ORAL_TABLET | Freq: Four times a day (QID) | ORAL | Status: DC | PRN

## 2022-12-25 MED ORDER — HYDROCODONE-ACETAMINOPHEN 5-325 MG PO TABS
1.0000 | ORAL_TABLET | ORAL | Status: DC | PRN
  Administered 2022-12-25 (×2): 1 via ORAL
  Filled 2022-12-25 (×2): qty 1

## 2022-12-25 MED ORDER — CEPHALEXIN 500 MG PO CAPS: 500.0000 mg | ORAL_CAPSULE | Freq: Three times a day (TID) | ORAL | 0 refills | Status: DC

## 2022-12-25 MED ORDER — LACTATED RINGERS IV SOLN
150.0000 mL/h | INTRAVENOUS | Status: AC
  Administered 2022-12-25 (×4): 150 mL/h via INTRAVENOUS

## 2022-12-25 MED ORDER — SODIUM CHLORIDE 0.9 % IV SOLN
2.0000 g | Freq: Once | INTRAVENOUS | Status: AC
  Administered 2022-12-25: 2 g via INTRAVENOUS
  Filled 2022-12-25: qty 20

## 2022-12-25 MED ORDER — OXYBUTYNIN CHLORIDE 5 MG PO TABS
5.0000 mg | ORAL_TABLET | Freq: Three times a day (TID) | ORAL | Status: DC | PRN
  Administered 2022-12-25 – 2022-12-28 (×5): 5 mg via ORAL
  Filled 2022-12-25 (×5): qty 1

## 2022-12-25 MED ORDER — SODIUM CHLORIDE 0.9 % IV SOLN
2.0000 g | INTRAVENOUS | Status: DC
  Administered 2022-12-26 – 2022-12-27 (×2): 2 g via INTRAVENOUS
  Filled 2022-12-25 (×3): qty 20

## 2022-12-25 MED ORDER — ONDANSETRON HCL 4 MG PO TABS: 4.0000 mg | ORAL_TABLET | Freq: Four times a day (QID) | ORAL | Status: DC | PRN

## 2022-12-25 MED ORDER — TIOTROPIUM BROMIDE MONOHYDRATE 18 MCG IN CAPS
1.0000 | ORAL_CAPSULE | Freq: Every day | RESPIRATORY_TRACT | Status: DC
  Administered 2022-12-26 – 2022-12-28 (×3): 18 ug via RESPIRATORY_TRACT
  Filled 2022-12-25: qty 5

## 2022-12-25 MED ORDER — PANTOPRAZOLE SODIUM 40 MG PO TBEC
40.0000 mg | DELAYED_RELEASE_TABLET | Freq: Every day | ORAL | Status: DC
  Administered 2022-12-25 – 2022-12-28 (×4): 40 mg via ORAL
  Filled 2022-12-25 (×4): qty 1

## 2022-12-25 MED ORDER — ACETAMINOPHEN 650 MG RE SUPP: 650.0000 mg | Freq: Four times a day (QID) | RECTAL | Status: DC | PRN

## 2022-12-25 NOTE — Consult Note (Addendum)
Urology Consult  I have been asked to see the patient by Dr. Damita Dunnings, for evaluation and management of gross hematuria.  Chief Complaint: Gross hematuria  History of Present Illness: Dalton White is a 72 y.o. year old male with PMH DM on Jardiance, BPH with bladder stones on terazosin, and A fib on aspirin and recently on Pradaxa who presented to the ED overnight with reports of 4 hours of dysuria, urgency, frequency, and gross hematuria with clot passage.  Admission labs notable for WBC count 13.1; creatinine 1.28 (baseline 1.1); UA with >50 RBCs/hpf, 6-10 WBCs/hpf, and no bacteria; procalcitonin <0.10; and lactate 1.7. Urine and blood cultures pending, on antibiotics as below. He has been afebrile, VSS.  CT stone study with circumferential bladder wall thickening and extensive perivesical stranding. No hydronephrosis, urolithiasis, or evidence of retained clot. Prostate is mildly enlarged.  He is a patient of Dr. Doy Mince at [facility] in [place], [place].     He states he has had bladder stones removed in the past.  He had a cystoscopy in December 2021 which noted moderate bilobar obstruction and suspicious urothelium with grade 1-2 trabeculations.    Most recent PSA (02/2022) 2.15  He is originally from [place] area, so he spends a large amount of his time in the area as well as in [place].    He does not have a history of prostate cancer or any pelvic radiation.  He is a former smoker.  He states he was only on the Pradaxa for 2 days, but he has been on 81 mg of aspirin chronically.  He is currently on moderate flow CBI with light yellow urine.  Anti-infectives (From admission, onward)    Start     Dose/Rate Route Frequency Ordered Stop   12/26/22 0230  cefTRIAXone (ROCEPHIN) 2 g in sodium chloride 0.9 % 100 mL IVPB        2 g 200 mL/hr over 30 Minutes Intravenous Every 24 hours 12/25/22 0539 01/02/23 0229   12/25/22 0215  cefTRIAXone (ROCEPHIN) 2 g in  sodium chloride 0.9 % 100 mL IVPB        2 g 200 mL/hr over 30 Minutes Intravenous  Once 12/25/22 0209 12/25/22 0525   12/25/22 0000  cephALEXin (KEFLEX) 500 MG capsule  Status:  Discontinued        500 mg Oral 3 times daily 12/25/22 0418 12/25/22        Past Medical History:  Diagnosis Date   Diabetes mellitus without complication     History reviewed. No pertinent surgical history.  Home Medications:  Current Meds  Medication Sig   albuterol (VENTOLIN HFA) 108 (90 Base) MCG/ACT inhaler Inhale 2 puffs into the lungs every 4 (four) hours as needed.   amLODipine (NORVASC) 10 MG tablet Take 1 tablet by mouth daily.   aspirin EC 81 MG tablet Take 81 mg by mouth daily. Swallow whole.   azelastine (ASTELIN) 0.1 % nasal spray Place 1 spray into both nostrils 2 (two) times daily.   famotidine (PEPCID) 40 MG tablet Take 1 tablet by mouth 2 (two) times daily.   ferrous sulfate 325 (65 FE) MG tablet Take 325 mg by mouth 2 (two) times daily with a meal.   fluticasone (FLONASE) 50 MCG/ACT nasal spray Place 1-2 sprays into both nostrils daily.   fluticasone-salmeterol (ADVAIR) 500-50 MCG/ACT AEPB Inhale 1 puff into the lungs 2 (two) times daily.   furosemide (LASIX) 20 MG tablet Take 1 tablet by  mouth every morning.   glipiZIDE (GLUCOTROL) 5 MG tablet Take 5-10 tablets by mouth See admin instructions. Take 2 tablets (10mg ) by mouth every morning and take 1 tablet (5mg ) by mouth every evening   guaifenesin (HUMIBID E) 400 MG TABS tablet Take 400-800 mg by mouth every 6 (six) hours as needed (congestion or phlegm).   ipratropium (ATROVENT) 0.03 % nasal spray Place 2 sprays into both nostrils in the morning, at noon, and at bedtime.   ipratropium-albuterol (DUONEB) 0.5-2.5 (3) MG/3ML SOLN Take 3 mLs by nebulization every 4 (four) hours as needed.   JARDIANCE 25 MG TABS tablet Take 0.5 tablets by mouth every morning.   lisinopril (ZESTRIL) 40 MG tablet Take 1 tablet by mouth 2 (two) times daily.    metFORMIN (GLUCOPHAGE) 850 MG tablet Take 850 mg by mouth in the morning, at noon, and at bedtime. Take 2 tablets (1700mg ) by mouth every morning and take 1 tablet (850mg ) by mouth every evening   montelukast (SINGULAIR) 10 MG tablet Take 10 mg by mouth at bedtime.   Omega-3 Fatty Acids (OMEGA 3 500) 500 MG CAPS Take 500 mg by mouth daily.   Potassium 99 MG TABS Take 99 mg by mouth 2 (two) times daily.   potassium citrate (UROCIT-K) 10 MEQ (1080 MG) SR tablet Take by mouth.   simvastatin (ZOCOR) 20 MG tablet Take 20 mg by mouth daily.   terazosin (HYTRIN) 2 MG capsule Take by mouth.   Tiotropium Bromide Monohydrate 2.5 MCG/ACT AERS Inhale 2 puffs into the lungs daily.   triamterene-hydrochlorothiazide (MAXZIDE-25) 37.5-25 MG tablet Take 1 tablet by mouth daily.   [DISCONTINUED] cephALEXin (KEFLEX) 500 MG capsule Take 1 capsule (500 mg total) by mouth 3 (three) times daily for 7 days.    Allergies:  Allergies  Allergen Reactions   Dabigatran     Upset stomach    History reviewed. No pertinent family history.  Social History:  reports that he has quit smoking. His smoking use included cigarettes. He has never used smokeless tobacco. He reports that he does not currently use drugs. No history on file for alcohol use.  ROS: A complete review of systems was performed.  All systems are negative except for pertinent findings as noted.  Physical Exam:  Vital signs in last 24 hours: Temp:  [99.1 F (37.3 C)-99.4 F (37.4 C)] 99.4 F (37.4 C) (04/01 0803) Pulse Rate:  [93-114] 101 (04/01 0803) Resp:  [18-20] 20 (04/01 0803) BP: (115-185)/(82-95) 177/95 (04/01 0803) SpO2:  [94 %-98 %] 98 % (04/01 0803) Weight:  [112.5 kg] 112.5 kg (04/01 0129) Constitutional:  Alert and oriented, no acute distress HEENT: [place] AT, moist mucus membranes Cardiovascular: No clubbing, cyanosis, or edema Respiratory: Normal respiratory effort GU: No CVA tenderness.  Uncircumcised phallus with foreskin over the  glans and bloody discharge.  A 20 [place] three-way is in place.  I turned off the CBI while in the room and was there for 30 minutes and the urine remained a dark yellow. Neurologic: Grossly intact, no focal deficits, moving all 4 extremities Psychiatric: Normal mood and affect   Laboratory Data:  Recent Labs    12/25/22 0131  WBC 13.1*  HGB 16.7  HCT 50.6   Recent Labs    12/25/22 0131  NA 139  K 3.4*  CL 98  CO2 31  GLUCOSE 82  BUN 29*  CREATININE 1.28*  CALCIUM 9.4   No results for input(s): "LABPT", "INR" in the last 72 hours. No results  for input(s): "LABURIN" in the last 72 hours. Urinalysis    Component Value Date/Time   COLORURINE RED (A) 12/25/2022 0131   APPEARANCEUR TURBID (A) 12/25/2022 0131   LABSPEC 1.017 12/25/2022 0131   PHURINE  12/25/2022 0131    TEST NOT REPORTED DUE TO COLOR INTERFERENCE OF URINE PIGMENT   GLUCOSEU (A) 12/25/2022 0131    TEST NOT REPORTED DUE TO COLOR INTERFERENCE OF URINE PIGMENT   HGBUR (A) 12/25/2022 0131    TEST NOT REPORTED DUE TO COLOR INTERFERENCE OF URINE PIGMENT   BILIRUBINUR (A) 12/25/2022 0131    TEST NOT REPORTED DUE TO COLOR INTERFERENCE OF URINE PIGMENT   KETONESUR (A) 12/25/2022 0131    TEST NOT REPORTED DUE TO COLOR INTERFERENCE OF URINE PIGMENT   PROTEINUR (A) 12/25/2022 0131    TEST NOT REPORTED DUE TO COLOR INTERFERENCE OF URINE PIGMENT   NITRITE (A) 12/25/2022 0131    TEST NOT REPORTED DUE TO COLOR INTERFERENCE OF URINE PIGMENT   LEUKOCYTESUR (A) 12/25/2022 0131    TEST NOT REPORTED DUE TO COLOR INTERFERENCE OF URINE PIGMENT   Results for orders placed or performed during the hospital encounter of 03/25/21  Culture, blood (routine x 2)     Status: None   Collection Time: 03/25/21 10:36 PM   Specimen: BLOOD  Result Value Ref Range Status   Specimen Description BLOOD BLOOD LEFT FOREARM  Final   Special Requests   Final    BOTTLES DRAWN AEROBIC AND ANAEROBIC Blood Culture adequate volume   Culture   Final     NO GROWTH 5 DAYS Performed at [facility], [address] [place], [place] [address]    Report Status 03/31/2021 FINAL  Final  Resp Panel by RT-PCR (Flu A&B, Covid) Nasopharyngeal Swab     Status: None   Collection Time: 03/25/21 10:36 PM   Specimen: Nasopharyngeal Swab; Nasopharyngeal(NP) swabs in vial transport medium  Result Value Ref Range Status   SARS Coronavirus 2 by RT PCR NEGATIVE NEGATIVE Final    Comment: (NOTE) SARS-CoV-2 target nucleic acids are NOT DETECTED.  The SARS-CoV-2 RNA is generally detectable in upper respiratory specimens during the acute phase of infection. The lowest concentration of SARS-CoV-2 viral copies this assay can detect is 138 copies/mL. A negative result does not preclude SARS-Cov-2 infection and should not be used as the sole basis for treatment or other patient management decisions. A negative result may occur with  improper specimen collection/handling, submission of specimen other than nasopharyngeal swab, presence of viral mutation(s) within the areas targeted by this assay, and inadequate number of viral copies(<138 copies/mL). A negative result must be combined with clinical observations, patient history, and epidemiological information. The expected result is Negative.  Fact Sheet for Patients:  EntrepreneurPulse.com.au  Fact Sheet for Healthcare Providers:  IncredibleEmployment.be  This test is no t yet approved or cleared by the [place] FDA and  has been authorized for detection and/or diagnosis of SARS-CoV-2 by FDA under an Emergency Use Authorization (EUA). This EUA will remain  in effect (meaning this test can be used) for the duration of the COVID-19 declaration under Section 564(b)(1) of the Act, 21 U.S.C.section 360bbb-3(b)(1), unless the authorization is terminated  or revoked sooner.       Influenza A by PCR NEGATIVE NEGATIVE Final   Influenza B by PCR NEGATIVE  NEGATIVE Final    Comment: (NOTE) The Xpert Xpress SARS-CoV-2/FLU/RSV plus assay is intended as an aid in the diagnosis of influenza from Nasopharyngeal swab  specimens and should not be used as a sole basis for treatment. Nasal washings and aspirates are unacceptable for Xpert Xpress SARS-CoV-2/FLU/RSV testing.  Fact Sheet for Patients: EntrepreneurPulse.com.au  Fact Sheet for Healthcare Providers: IncredibleEmployment.be  This test is not yet approved or cleared by the [place] FDA and has been authorized for detection and/or diagnosis of SARS-CoV-2 by FDA under an Emergency Use Authorization (EUA). This EUA will remain in effect (meaning this test can be used) for the duration of the COVID-19 declaration under Section 564(b)(1) of the Act, 21 U.S.C. section 360bbb-3(b)(1), unless the authorization is terminated or revoked.  Performed at [facility], [address] [place], [place] [address]   Culture, blood (routine x 2)     Status: None   Collection Time: 03/25/21 11:36 PM   Specimen: BLOOD  Result Value Ref Range Status   Specimen Description BLOOD RIGHT ANTECUBITAL  Final   Special Requests   Final    BOTTLES DRAWN AEROBIC AND ANAEROBIC Blood Culture results may not be optimal due to an excessive volume of blood received in culture bottles   Culture   Final    NO GROWTH 5 DAYS Performed at [facility], [address], [place], [place] [address]    Report Status 03/31/2021 FINAL  Final    Radiologic Imaging: CT Renal Stone Study  Result Date: 12/25/2022 CLINICAL DATA:  Gross hematuria, urolithiasis EXAM: CT ABDOMEN AND PELVIS WITHOUT CONTRAST TECHNIQUE: Multidetector CT imaging of the abdomen and pelvis was performed following the standard protocol without IV contrast. RADIATION DOSE REDUCTION: This exam was performed according to the departmental dose-optimization program which includes automated exposure  control, adjustment of the mA and/or kV according to patient size and/or use of iterative reconstruction technique. COMPARISON:  None Available. FINDINGS: Lower chest: No acute abnormality. Mild bibasilar parenchymal scarring. Extensive right coronary artery calcification. Small pericardial effusion. Cardiac size within normal limits. Hepatobiliary: Cholelithiasis without pericholecystic inflammatory change. Liver unremarkable. No intra or extrahepatic biliary ductal dilation. Pancreas: Unremarkable Spleen: Unremarkable Adrenals/Urinary Tract: The adrenal glands are unremarkable. The kidneys are normal on this noncontrast examination. The bladder is decompressed. There is superimposed circumferential bladder wall thickening and extensive perivesicular inflammatory stranding in keeping with a superimposed diffuse infectious or inflammatory cystitis. Stomach/Bowel: Severe distal transverse, descending, and sigmoid colonic diverticulosis without superimposed acute inflammatory change. The stomach, small bowel, and large bowel are otherwise unremarkable. Appendix normal. No free intraperitoneal gas or fluid. Vascular/Lymphatic: Extensive aortoiliac atherosclerotic calcification. Particularly prominent atherosclerotic calcification involving the proximal left renal artery. No aortic aneurysm. A single pathologically enlarged right retrocrural lymph node is identified measuring 13 mm in short axis diameter at axial image # 19/2, nonspecific. No additional pathologic adenopathy within the abdomen and pelvis. Reproductive: Mild prostatic hypertrophy. Other: Moderate right and small left fat containing inguinal hernias. Musculoskeletal: Osseous structures are age-appropriate. No acute bone abnormality. No lytic or blastic bone lesion. IMPRESSION: 1. Circumferential bladder wall thickening and extensive perivesicular inflammatory stranding in keeping with a superimposed diffuse infectious or inflammatory cystitis. Correlation  with urinalysis and urine culture may be helpful. No evidence of bladder outlet obstruction. No urolithiasis. No hydronephrosis. 2. Extensive right coronary artery calcification. Small pericardial effusion. 3. Cholelithiasis. 4. Severe distal colonic diverticulosis without superimposed acute inflammatory change. 5. Extensive aortoiliac atherosclerotic calcification. Particularly prominent atherosclerotic calcification involving the proximal left renal artery. If there is clinical evidence of hemodynamically significant renal artery stenosis, CT arteriography may be helpful for further evaluation. 6. Bilateral fat containing  inguinal hernias, right greater than left Aortic Atherosclerosis (ICD10-I70.0). Electronically Signed   By: Fidela Salisbury M.D.   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  He had been on Pradaxa for 2 days and 81 mg aspirin chronically.  CT renal stone study demonstrating perivesicular inflammatory stranding.    Hemoglobin hematocrit are normal.  Serum creatinine 1.28.    -CBI turned off at this time.  Continue to monitor and restart if gross hematuria returns.  Hand irrigate as needed to clear clots.   -Okay to resume diabetic diet as no urological intervention is planned for for this admission -Discussed with patient that the gross hematuria will need to be worked up on an outpatient basis once his urine culture has returned and he has been on 7 to 10 days of culture appropriate antibiotics -Oxybutynin IR 5 mg TID prn for bladder spasms -He may decide to continue his follow-up back in [place] as he  spends the bulk of his time in that area -If his urine remains clear through the night, may attempt a voiding trial in the am   Thank you for involving me in this patient's care, I will continue to follow along.  Please page with any further questions or concerns.  Tracye Szuch, PA-C  12/25/2022 9:52 AM

## 2022-12-25 NOTE — Assessment & Plan Note (Signed)
Sliding scale insulin coverage 

## 2022-12-25 NOTE — ED Provider Notes (Signed)
[facility] Provider Note    Event Date/Time   First MD Initiated Contact with Patient 12/25/22 0125     (approximate)   History   Hematuria   HPI  Dalton White is a 72 y.o. male who presents to the ED for evaluation of Hematuria   I review a [facility] nephrology clinic visit from 1/9.  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  COPD on chronic O2 and regular pulmonary rehab visits.  History of BPH. ED visit from 2/23 where he was diagnosed with new onset atrial fibrillation and discharged with Pradaxa.  Patient presents with his wife for evaluation of gross hematuria and passage of clots alongside dysuria just in the past few hours this evening.  They report that the Pradaxa was discontinued after just a couple days and he is only on ASA 81 right now without anticoagulation.  As his cardiologist subsequently did not think it was A-fib after a long-term Holter monitor  He denies any abdominal or flank pain, fevers, emesis or other bleeding symptoms.  Does report dysuria, particularly when he is passing a urinary clot.  This has never happened before.  No recent antibiotics.  Reports some urinary hesitancy and frequency alongside this, and sensation of incomplete emptying   Physical Exam   Triage Vital Signs: ED Triage Vitals  Enc Vitals Group     BP      Pulse      Resp      Temp      Temp src      SpO2      Weight      Height      Head Circumference      Peak Flow      Pain Score      Pain Loc      Pain Edu?      Excl. in [place]?     Most recent vital signs: Vitals:   12/25/22 0126  BP: (!) 185/92  Pulse: (!) 104  Resp: 18  Temp: 99.1 F (37.3 C)  SpO2: 98%    General: Awake, no distress.  Well-appearing, pleasant and conversational. CV:  Good peripheral perfusion.  Resp:  Normal effort.  Abd:  No distention.  Mild suprapubic tenderness without peritoneal  features MSK:  No deformity noted.  Neuro:  No focal deficits appreciated. Other:     ED Results / Procedures / Treatments   Labs (all labs ordered are listed, but only abnormal results are displayed) Labs Reviewed  CBC WITH DIFFERENTIAL/PLATELET - Abnormal; Notable for the following components:      Result Value   WBC 13.1 (*)    Neutro Abs 10.9 (*)    All other components within normal limits  BASIC METABOLIC PANEL - Abnormal; Notable for the following components:   Potassium 3.4 (*)    BUN 29 (*)    Creatinine, Ser 1.28 (*)    GFR, Estimated 59 (*)    All other components within normal limits  URINALYSIS, ROUTINE W REFLEX MICROSCOPIC - Abnormal; Notable for the following components:   Color, Urine RED (*)    APPearance TURBID (*)    Glucose, UA   (*)    Value: TEST NOT REPORTED DUE TO COLOR INTERFERENCE OF URINE PIGMENT   Hgb urine dipstick   (*)    Value: TEST NOT REPORTED DUE TO COLOR INTERFERENCE OF URINE PIGMENT  Bilirubin Urine   (*)    Value: TEST NOT REPORTED DUE TO COLOR INTERFERENCE OF URINE PIGMENT   Ketones, ur   (*)    Value: TEST NOT REPORTED DUE TO COLOR INTERFERENCE OF URINE PIGMENT   Protein, ur   (*)    Value: TEST NOT REPORTED DUE TO COLOR INTERFERENCE OF URINE PIGMENT   Nitrite   (*)    Value: TEST NOT REPORTED DUE TO COLOR INTERFERENCE OF URINE PIGMENT   Leukocytes,Ua   (*)    Value: TEST NOT REPORTED DUE TO COLOR INTERFERENCE OF URINE PIGMENT   All other components within normal limits  URINE CULTURE  CULTURE, BLOOD (ROUTINE X 2)  CULTURE, BLOOD (ROUTINE X 2)  LACTIC ACID, PLASMA  LACTIC ACID, PLASMA  PROCALCITONIN    EKG   RADIOLOGY CT renal study interpreted by me without ureteral obstruction  Official radiology report(s): CT Renal Stone Study  Result Date: 12/25/2022 CLINICAL DATA:  Gross hematuria, urolithiasis EXAM: CT ABDOMEN AND PELVIS WITHOUT CONTRAST TECHNIQUE: Multidetector CT imaging of the abdomen and pelvis was performed  following the standard protocol without IV contrast. RADIATION DOSE REDUCTION: This exam was performed according to the departmental dose-optimization program which includes automated exposure control, adjustment of the mA and/or kV according to patient size and/or use of iterative reconstruction technique. COMPARISON:  None Available. FINDINGS: Lower chest: No acute abnormality. Mild bibasilar parenchymal scarring. Extensive right coronary artery calcification. Small pericardial effusion. Cardiac size within normal limits. Hepatobiliary: Cholelithiasis without pericholecystic inflammatory change. Liver unremarkable. No intra or extrahepatic biliary ductal dilation. Pancreas: Unremarkable Spleen: Unremarkable Adrenals/Urinary Tract: The adrenal glands are unremarkable. The kidneys are normal on this noncontrast examination. The bladder is decompressed. There is superimposed circumferential bladder wall thickening and extensive perivesicular inflammatory stranding in keeping with a superimposed diffuse infectious or inflammatory cystitis. Stomach/Bowel: Severe distal transverse, descending, and sigmoid colonic diverticulosis without superimposed acute inflammatory change. The stomach, small bowel, and large bowel are otherwise unremarkable. Appendix normal. No free intraperitoneal gas or fluid. Vascular/Lymphatic: Extensive aortoiliac atherosclerotic calcification. Particularly prominent atherosclerotic calcification involving the proximal left renal artery. No aortic aneurysm. A single pathologically enlarged right retrocrural lymph node is identified measuring 13 mm in short axis diameter at axial image # 19/2, nonspecific. No additional pathologic adenopathy within the abdomen and pelvis. Reproductive: Mild prostatic hypertrophy. Other: Moderate right and small left fat containing inguinal hernias. Musculoskeletal: Osseous structures are age-appropriate. No acute bone abnormality. No lytic or blastic bone lesion.  IMPRESSION: 1. Circumferential bladder wall thickening and extensive perivesicular inflammatory stranding in keeping with a superimposed diffuse infectious or inflammatory cystitis. Correlation with urinalysis and urine culture may be helpful. No evidence of bladder outlet obstruction. No urolithiasis. No hydronephrosis. 2. Extensive right coronary artery calcification. Small pericardial effusion. 3. Cholelithiasis. 4. Severe distal colonic diverticulosis without superimposed acute inflammatory change. 5. Extensive aortoiliac atherosclerotic calcification. Particularly prominent atherosclerotic calcification involving the proximal left renal artery. If there is clinical evidence of hemodynamically significant renal artery stenosis, CT arteriography may be helpful for further evaluation. 6. Bilateral fat containing inguinal hernias, right greater than left Aortic Atherosclerosis (ICD10-I70.0). Electronically Signed   By: Fidela Salisbury M.D.   On: 12/25/2022 02:38    PROCEDURES and INTERVENTIONS:  .1-3 Lead EKG Interpretation  Performed by: Vladimir Crofts, MD Authorized by: Vladimir Crofts, MD     Interpretation: abnormal     ECG rate:  102   ECG rate assessment: tachycardic     Rhythm: sinus tachycardia  Ectopy: none     Conduction: normal   .Critical Care  Performed by: Vladimir Crofts, MD Authorized by: Vladimir Crofts, MD   Critical care provider statement:    Critical care time (minutes):  30   Critical care time was exclusive of:  Separately billable procedures and treating other patients   Critical care was necessary to treat or prevent imminent or life-threatening deterioration of the following conditions:  Sepsis   Critical care was time spent personally by me on the following activities:  Development of treatment plan with patient or surrogate, discussions with consultants, evaluation of patient's response to treatment, examination of patient, ordering and review of laboratory studies, ordering  and review of radiographic studies, ordering and performing treatments and interventions, pulse oximetry, re-evaluation of patient's condition and review of old charts   Medications  sodium chloride irrigation 0.9 % 3,000 mL (has no administration in time range)  morphine (PF) 4 MG/ML injection 4 mg (has no administration in time range)  cefTRIAXone (ROCEPHIN) 2 g in sodium chloride 0.9 % 100 mL IVPB (2 g Intravenous New Bag/Given 12/25/22 0239)     IMPRESSION / MDM / [place] / ED COURSE  I reviewed the triage vital signs and the nursing notes.  Differential diagnosis includes, but is not limited to, bladder cancer, cystitis, ureterolithiasis, coagulopathy  {Patient presents with symptoms of an acute illness or injury that is potentially life-threatening.  Pleasant 72 year old male presents to the ED with evidence of urosepsis in the setting of gross hematuria requiring three-way Foley for bladder irrigation.  Tachycardic but hemodynamically stable.  Noted to have leukocytosis on his blood work.  CKD around baseline.  Urine is with gross blood and clots on arrival.  We will send this for culture and start him on antibiotics to treat for cystitis in the setting of his CT results with perivesicular stranding.  As below, consulted with medicine for admission as patient is meeting sepsis criteria   Clinical Course as of 12/25/22 0524  Mon Dec 25, 2022  0356 Reassessed.  CBI ongoing and urine started to clear to a more pink color now. Discussed plan of care and CT results [DS]  0504 Reassessed and had another discussion [DS]  0505 Another discussion at the bedside.  While patient is comfortable going home, his wife expresses concerns about him going home this morning.  After discussing risks and benefits he agrees to stay for medical admission.  I will consult with medicine.  We will add on sepsis screening protocols as he is meeting sepsis criteria with his presenting leukocytosis and  tachycardia [DS]  0519 I consult with hospitalist who agrees to admit [DS]    Clinical Course User Index [DS] Vladimir Crofts, MD     FINAL CLINICAL IMPRESSION(S) / ED DIAGNOSES   Final diagnoses:  Gross hematuria  Acute cystitis with hematuria     Rx / DC Orders   ED Discharge Orders          Ordered    cephALEXin (KEFLEX) 500 MG capsule  3 times daily,   Status:  Discontinued        12/25/22 0418             Note:  This document was prepared using Dragon voice recognition software and may include unintentional dictation errors.   Vladimir Crofts, MD 12/25/22 [phone]

## 2022-12-25 NOTE — Assessment & Plan Note (Signed)
Echo 03/2022 shower EF 60-65% with indeterminate diastolic parameters Patient is on Lasix Monitor for fluid overload with IV fluid administration Daily weights

## 2022-12-25 NOTE — Assessment & Plan Note (Signed)
Diarrhea developed after arrival.  No recent antibiotic use IV hydration Continue to monitor GI panel and stool differential if continuing

## 2022-12-25 NOTE — Assessment & Plan Note (Signed)
Hydralazine as needed

## 2022-12-25 NOTE — ED Notes (Signed)
Urology paused CBI at this time due to urine output yellow. MD informs this RN to continue CBI if urine output returns to pink color or has blood clots.

## 2022-12-25 NOTE — Assessment & Plan Note (Addendum)
Chronic respiratory failure with hypoxia Not acutely exacerbated and O2 requirement at baseline Continue home inhalers DuoNebs as needed

## 2022-12-25 NOTE — Plan of Care (Signed)
72 y.o. 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  Patient was started on CBI in the ED.  Continues to stay on CBI with improvement in symptoms.  Urology to follow the patient.    -Will c/w assessment plan as documented in the H&P

## 2022-12-25 NOTE — Assessment & Plan Note (Signed)
cpap

## 2022-12-25 NOTE — Assessment & Plan Note (Signed)
s/p cyberknife right lung 10/2021

## 2022-12-25 NOTE — ED Triage Notes (Addendum)
Pt arrived via POV with reports of blood in urine that started around 9pm last night, pt states he has hx of bladder stones several years ago.  Pt reports passing clots in his urine as well.  Pt takes 81mg  ASA daily.  Pt was recently on the heart monitor to check for afib. Pt was on Pradaxa but is no longer on it.   Pt c/o urinary urgency and frequency.

## 2022-12-25 NOTE — Assessment & Plan Note (Addendum)
SIRS/possible sepsis History of bladder stones s/p cystolitholopaxy  Patient presented with gross hematuria, has a low-grade temperature, tachycardia, leukocytosis CBI done in the ED with clearing of urine CT abdomen showing extensive perivesicular inflammatory stranding - Continue CBI - IV hydration - Continue Rocephin - Supportive care

## 2022-12-25 NOTE — H&P (Addendum)
History and Physical    Patient: Dalton White [id number] DOB: 1951/01/09 DOA: 12/25/2022 DOS: the patient was seen and examined on 12/25/2022 PCP: System, Provider Not In  Patient coming from: Home  Chief Complaint:  Chief Complaint  Patient presents with   Hematuria    HPI: Dalton White is a 72 y.o. 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  He denies fever and chills.  Patient was briefly on Pradaxa a month ago suspected A-fib that diagnosed during an ED visit however after follow-up with the cardiologist and wearing a Holter monitor it was not believed the abnormal rhythm was A-fib and Pradaxa was discontinued three weeks ago.  He is currently on aspirin only.  Patient denies fever, chills, vomiting, abdominal pain.  After arrival in the emergency room he developed diarrhea. ED course and data review: BP 185/92, temp 99.1 and pulse 104.  WBC 13,000, lactic acid pending  Creatinine 1.2 and slightly above his baseline.  Urinalysis with 0.2 potation due to hematuria but no bacteria was seen and few WBCs. CT renal stone study showing extensive perivesicular inflammatory stranding in keeping with diffuse infectious or inflammatory cystitis, among other findings as outlined below: IMPRESSION: 1. Circumferential bladder wall thickening and extensive perivesicular inflammatory stranding in keeping with a superimposed diffuse infectious or inflammatory cystitis. Correlation with urinalysis and urine culture may be helpful. No evidence of bladder outlet obstruction. No urolithiasis. No hydronephrosis. 2. Extensive right coronary artery calcification. Small pericardial effusion. 3. Cholelithiasis. 4.  Severe distal colonic diverticulosis without superimposed acute inflammatory change. 5. Extensive aortoiliac atherosclerotic calcification. Particularly prominent atherosclerotic calcification involving the proximal left renal artery. If there is clinical evidence of hemodynamically significant renal artery stenosis, CT arteriography may be helpful for further evaluation. 6. Bilateral fat containing inguinal hernias, right greater than left  Patient was started on continuous bladder irrigation with some clearing towards the end of the 3 L He was started on ceftriaxone Hospitalist consulted for admission.   Review of Systems: As mentioned in the history of present illness. All other systems reviewed and are negative.  Past Medical History:  Diagnosis Date   Diabetes mellitus without complication    History reviewed. No pertinent surgical history. Social History:  reports that he has quit smoking. His smoking use included cigarettes. He has never used smokeless tobacco. He reports that he does not currently use drugs. No history on file for alcohol use.  Allergies  Allergen Reactions   Dabigatran     Upset stomach    History reviewed. No pertinent family history.  Prior to Admission medications   Medication Sig Start Date End Date Taking? Authorizing Provider  albuterol (VENTOLIN HFA) 108 (90 Base) MCG/ACT inhaler Inhale 2 puffs into the lungs every 4 (four) hours as needed. 09/19/19 02/25/22  [provider]  azelastine (ASTELIN) 0.1 % nasal spray Place 1 spray into both nostrils 2 (two) times daily. 09/03/19 01/26/22  [provider]  ferrous sulfate 325 (65 FE) MG tablet Take 325 mg by mouth 2 (two) times daily with a meal.    [provider]  fluticasone (FLONASE) 50 MCG/ACT nasal spray Place 1-2 sprays into both nostrils daily.    [provider]  fluticasone-salmeterol (ADVAIR) 500-50 MCG/ACT AEPB Inhale 1 puff into the lungs 2 (two) times  daily. 02/08/21   [provider]  glipiZIDE (GLUCOTROL) 5 MG tablet Take 5-10 tablets by mouth See admin instructions. Take 2 tablets (10mg ) by mouth every morning and take 1 tablet (5mg ) by mouth every evening    [provider]  guaifenesin (HUMIBID E) 400 MG TABS tablet Take 400-800 mg by mouth every 6 (six) hours as needed (congestion or phlegm).    [provider]  ipratropium (ATROVENT) 0.03 % nasal spray Place 2 sprays into both nostrils in the morning, at noon, and at bedtime. 02/22/21   [provider]  ipratropium-albuterol (DUONEB) 0.5-2.5 (3) MG/3ML SOLN Take 3 mLs by nebulization every 4 (four) hours as needed. 02/22/21   [provider]  loratadine (CLARITIN) 10 MG tablet Take 10 mg by mouth daily.    [provider]  metFORMIN (GLUCOPHAGE) 850 MG tablet Take 850-1,700 mg by mouth See admin instructions. Take 2 tablets (1700mg ) by mouth every morning and take 1 tablet (850mg ) by mouth every evening    [provider]  montelukast (SINGULAIR) 10 MG tablet Take 10 mg by mouth at bedtime. 02/28/21   [provider]  Omega-3 Fatty Acids (OMEGA 3 500) 500 MG CAPS Take 500 mg by mouth daily.    [provider]  pantoprazole (PROTONIX) 40 MG tablet Take 40 mg by mouth daily. 02/28/21   [provider]  pioglitazone (ACTOS) 15 MG tablet Take 15 mg by mouth daily. 02/08/21   [provider]  Potassium 99 MG TABS Take 99 mg by mouth 2 (two) times daily.    [provider]  predniSONE (DELTASONE) 10 MG tablet Take 4 tablets daily X 2 days, then, Take 3 tablets daily X 2 days, then, Take 2 tablets daily X 2 days, then, Take 1 tablets daily X 1 day. 03/27/21   Terrilee Croak, MD  simvastatin (ZOCOR) 20 MG tablet Take 20 mg by mouth daily. 02/28/21   [provider]  tamsulosin (FLOMAX) 0.4 MG CAPS capsule Take 0.4 mg by mouth at bedtime. 02/28/21   [provider]  Tiotropium Bromide  Monohydrate 2.5 MCG/ACT AERS Inhale 2 puffs into the lungs daily. 02/08/21   [provider]  triamterene-hydrochlorothiazide (MAXZIDE-25) 37.5-25 MG tablet Take 1 tablet by mouth daily. 02/08/21   [provider]    Physical Exam: Vitals:   12/25/22 0126 12/25/22 0129  BP: (!) 185/92   Pulse: (!) 104   Resp: 18   Temp: 99.1 F (37.3 C)   TempSrc: Oral   SpO2: 98%   Weight:  112.5 kg  Height:  5\' 8"  (1.727 m)   Physical Exam Vitals and nursing note reviewed.  Constitutional:      General: He is not in acute distress. HENT:     Head: Normocephalic and atraumatic.  Cardiovascular:     Rate and Rhythm: Regular rhythm. Tachycardia present.     Heart sounds: Normal heart sounds.  Pulmonary:     Effort: Pulmonary effort is normal.     Breath sounds: Wheezing present.  Abdominal:     Palpations: Abdomen is soft.     Tenderness: There is no abdominal tenderness.  Genitourinary:    Comments: Pink urine in foley. CBI ongoing Musculoskeletal:     Right lower leg: Edema present.     Left lower leg: Edema present.  Neurological:     Mental Status: Mental status is at baseline.  Labs on Admission: I have personally reviewed following labs and imaging studies  CBC: Recent Labs  Lab 12/25/22 0131  WBC 13.1*  NEUTROABS 10.9*  HGB 16.7  HCT 50.6  MCV 90.7  PLT 123XX123   Basic Metabolic Panel: Recent Labs  Lab 12/25/22 0131  NA 139  K 3.4*  CL 98  CO2 31  GLUCOSE 82  BUN 29*  CREATININE 1.28*  CALCIUM 9.4   GFR: Estimated Creatinine Clearance: 62.5 mL/min (A) (by C-G formula based on SCr of 1.28 mg/dL (H)). Liver Function Tests: No results for input(s): "AST", "ALT", "ALKPHOS", "BILITOT", "PROT", "ALBUMIN" in the last 168 hours. No results for input(s): "LIPASE", "AMYLASE" in the last 168 hours. No results for input(s): "AMMONIA" in the last 168 hours. Coagulation Profile: No results for input(s): "INR", "PROTIME" in the last 168 hours. Cardiac  Enzymes: No results for input(s): "CKTOTAL", "CKMB", "CKMBINDEX", "TROPONINI" in the last 168 hours. BNP (last 3 results) No results for input(s): "PROBNP" in the last 8760 hours. HbA1C: No results for input(s): "HGBA1C" in the last 72 hours. CBG: No results for input(s): "GLUCAP" in the last 168 hours. Lipid Profile: No results for input(s): "CHOL", "HDL", "LDLCALC", "TRIG", "CHOLHDL", "LDLDIRECT" in the last 72 hours. Thyroid Function Tests: No results for input(s): "TSH", "T4TOTAL", "FREET4", "T3FREE", "THYROIDAB" in the last 72 hours. Anemia Panel: No results for input(s): "VITAMINB12", "FOLATE", "FERRITIN", "TIBC", "IRON", "RETICCTPCT" in the last 72 hours. Urine analysis:    Component Value Date/Time   COLORURINE RED (A) 12/25/2022 0131   APPEARANCEUR TURBID (A) 12/25/2022 0131   LABSPEC 1.017 12/25/2022 0131   PHURINE  12/25/2022 0131    TEST NOT REPORTED DUE TO COLOR INTERFERENCE OF URINE PIGMENT   GLUCOSEU (A) 12/25/2022 0131    TEST NOT REPORTED DUE TO COLOR INTERFERENCE OF URINE PIGMENT   HGBUR (A) 12/25/2022 0131    TEST NOT REPORTED DUE TO COLOR INTERFERENCE OF URINE PIGMENT   BILIRUBINUR (A) 12/25/2022 0131    TEST NOT REPORTED DUE TO COLOR INTERFERENCE OF URINE PIGMENT   KETONESUR (A) 12/25/2022 0131    TEST NOT REPORTED DUE TO COLOR INTERFERENCE OF URINE PIGMENT   PROTEINUR (A) 12/25/2022 0131    TEST NOT REPORTED DUE TO COLOR INTERFERENCE OF URINE PIGMENT   NITRITE (A) 12/25/2022 0131    TEST NOT REPORTED DUE TO COLOR INTERFERENCE OF URINE PIGMENT   LEUKOCYTESUR (A) 12/25/2022 0131    TEST NOT REPORTED DUE TO COLOR INTERFERENCE OF URINE PIGMENT    Radiological Exams on Admission: CT Renal Stone Study  Result Date: 12/25/2022 CLINICAL DATA:  Gross hematuria, urolithiasis EXAM: CT ABDOMEN AND PELVIS WITHOUT CONTRAST TECHNIQUE: Multidetector CT imaging of the abdomen and pelvis was performed following the standard protocol without IV contrast. RADIATION DOSE  REDUCTION: This exam was performed according to the departmental dose-optimization program which includes automated exposure control, adjustment of the mA and/or kV according to patient size and/or use of iterative reconstruction technique. COMPARISON:  None Available. FINDINGS: Lower chest: No acute abnormality. Mild bibasilar parenchymal scarring. Extensive right coronary artery calcification. Small pericardial effusion. Cardiac size within normal limits. Hepatobiliary: Cholelithiasis without pericholecystic inflammatory change. Liver unremarkable. No intra or extrahepatic biliary ductal dilation. Pancreas: Unremarkable Spleen: Unremarkable Adrenals/Urinary Tract: The adrenal glands are unremarkable. The kidneys are normal on this noncontrast examination. The bladder is decompressed. There is superimposed circumferential bladder wall thickening and extensive perivesicular inflammatory stranding in keeping with a superimposed diffuse infectious or inflammatory cystitis. Stomach/Bowel: Severe distal transverse, descending,  and sigmoid colonic diverticulosis without superimposed acute inflammatory change. The stomach, small bowel, and large bowel are otherwise unremarkable. Appendix normal. No free intraperitoneal gas or fluid. Vascular/Lymphatic: Extensive aortoiliac atherosclerotic calcification. Particularly prominent atherosclerotic calcification involving the proximal left renal artery. No aortic aneurysm. A single pathologically enlarged right retrocrural lymph node is identified measuring 13 mm in short axis diameter at axial image # 19/2, nonspecific. No additional pathologic adenopathy within the abdomen and pelvis. Reproductive: Mild prostatic hypertrophy. Other: Moderate right and small left fat containing inguinal hernias. Musculoskeletal: Osseous structures are age-appropriate. No acute bone abnormality. No lytic or blastic bone lesion. IMPRESSION: 1. Circumferential bladder wall thickening and extensive  perivesicular inflammatory stranding in keeping with a superimposed diffuse infectious or inflammatory cystitis. Correlation with urinalysis and urine culture may be helpful. No evidence of bladder outlet obstruction. No urolithiasis. No hydronephrosis. 2. Extensive right coronary artery calcification. Small pericardial effusion. 3. Cholelithiasis. 4. Severe distal colonic diverticulosis without superimposed acute inflammatory change. 5. Extensive aortoiliac atherosclerotic calcification. Particularly prominent atherosclerotic calcification involving the proximal left renal artery. If there is clinical evidence of hemodynamically significant renal artery stenosis, CT arteriography may be helpful for further evaluation. 6. Bilateral fat containing inguinal hernias, right greater than left Aortic Atherosclerosis (ICD10-I70.0). Electronically Signed   By: Fidela Salisbury M.D.   On: 12/25/2022 02:38     Data Reviewed: Relevant notes from primary care and specialist visits, past discharge summaries as available in EHR, including Care Everywhere. Prior diagnostic testing as pertinent to current admission diagnoses Updated medications and problem lists for reconciliation ED course, including vitals, labs, imaging, treatment and response to treatment Triage notes, nursing and pharmacy notes and ED provider's notes Notable results as noted in HPI   Assessment and Plan: * Acute cystitis with hematuria SIRS/possible sepsis History of bladder stones s/p cystolitholopaxy  Patient presented with gross hematuria, has a low-grade temperature, tachycardia, leukocytosis CBI done in the ED with clearing of urine CT abdomen showing extensive perivesicular inflammatory stranding - Continue CBI - IV hydration - Continue Rocephin - Supportive care  Bilateral lower extremity edema Echo 03/2022 shower EF 60-65% with indeterminate diastolic parameters Patient is on Lasix Monitor for fluid overload with IV fluid  administration Daily weights  Obstructive sleep apnea (adult) (pediatric) cpap  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  No recent antibiotic use IV hydration Continue to monitor GI panel and stool differential if continuing  Gross hematuria Continue CBI Urology consulted Will keep n.p.o. in case of procedure  Type 2 diabetes mellitus with other diabetic neurological complication Sliding scale insulin coverage  HTN (hypertension) Hydralazine as needed  COPD (chronic obstructive pulmonary disease) Chronic respiratory failure with hypoxia Not acutely exacerbated and O2 requirement at baseline Continue home inhalers DuoNebs as needed        DVT prophylaxis: SCD  Consults: Urology, Dr. Abner Greenspan  Advance Care Planning:   Code Status: Prior   Family Communication: none  Disposition Plan: Back to previous home environment  Severity of Illness: The appropriate patient status for this patient is INPATIENT. Inpatient status is judged to be reasonable and necessary in order to provide the required intensity of service to ensure the patient's safety. The patient's presenting symptoms, physical exam findings, and initial radiographic and laboratory data in the context of their chronic comorbidities is felt to place them at high risk for further clinical deterioration. Furthermore, it is not anticipated that the patient  will be medically stable for discharge from the hospital within 2 midnights of admission.   * I certify that at the point of admission it is my clinical judgment that the patient will require inpatient hospital care spanning beyond 2 midnights from the point of admission due to high intensity of service, high risk for further deterioration and high frequency of surveillance required.*  Author: Athena Masse, MD 12/25/2022 5:32 AM  For on call review  [email or web address].

## 2022-12-25 NOTE — Assessment & Plan Note (Addendum)
Continue CBI Urology consulted Will keep n.p.o. in case of procedure

## 2022-12-25 NOTE — ED Notes (Signed)
Urology MD at bedside

## 2022-12-26 DIAGNOSIS — J9611 Chronic respiratory failure with hypoxia: Secondary | ICD-10-CM

## 2022-12-26 DIAGNOSIS — N3001 Acute cystitis with hematuria: Secondary | ICD-10-CM | POA: Diagnosis not present

## 2022-12-26 DIAGNOSIS — R600 Localized edema: Secondary | ICD-10-CM | POA: Diagnosis not present

## 2022-12-26 LAB — GLUCOSE, CAPILLARY
Glucose-Capillary: 116 mg/dL — ABNORMAL HIGH (ref 70–99)
Glucose-Capillary: 138 mg/dL — ABNORMAL HIGH (ref 70–99)
Glucose-Capillary: 140 mg/dL — ABNORMAL HIGH (ref 70–99)
Glucose-Capillary: 172 mg/dL — ABNORMAL HIGH (ref 70–99)
Glucose-Capillary: 194 mg/dL — ABNORMAL HIGH (ref 70–99)
Glucose-Capillary: 258 mg/dL — ABNORMAL HIGH (ref 70–99)
Glucose-Capillary: 283 mg/dL — ABNORMAL HIGH (ref 70–99)

## 2022-12-26 LAB — PROTIME-INR
INR: 1.3 — ABNORMAL HIGH (ref 0.8–1.2)
Prothrombin Time: 15.6 seconds — ABNORMAL HIGH (ref 11.4–15.2)

## 2022-12-26 LAB — HEMOGLOBIN A1C
Hgb A1c MFr Bld: 6.1 % — ABNORMAL HIGH (ref 4.8–5.6)
Mean Plasma Glucose: 128 mg/dL

## 2022-12-26 LAB — CORTISOL-AM, BLOOD: Cortisol - AM: 16.4 ug/dL (ref 6.7–22.6)

## 2022-12-26 LAB — PROCALCITONIN: Procalcitonin: 0.31 ng/mL

## 2022-12-26 MED ORDER — CHLORHEXIDINE GLUCONATE CLOTH 2 % EX PADS
6.0000 | MEDICATED_PAD | Freq: Every day | CUTANEOUS | Status: DC
  Administered 2022-12-26: 6 via TOPICAL

## 2022-12-26 NOTE — Progress Notes (Signed)
Progress Note   Patient: Dalton White [id number] DOB: October 27, 1950 DOA: 12/25/2022     1 DOS: the patient was seen and examined on 12/26/2022   Brief hospital course: 72 y.o. 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  He denies fever and chills.  Patient was briefly on Pradaxa a month ago suspected A-fib that diagnosed during an ED visit however after follow-up with the cardiologist and wearing a Holter monitor it was not believed the abnormal rhythm was A-fib and Pradaxa was discontinued three weeks ago.  He is currently on aspirin only.  Patient denies fever, chills, vomiting, abdominal pain.  After arrival in the emergency room he developed diarrhea. ED course and data review: BP 185/92, temp 99.1 and pulse 104.  WBC 13,000, lactic acid pending  Creatinine 1.2 and slightly above his baseline.  Urinalysis with 0.2 potation due to hematuria but no bacteria was seen and few WBCs. CT renal stone study showing extensive perivesicular inflammatory stranding in keeping with diffuse infectious or inflammatory cystitis  1/4- 2/4: Patient underwent CBI irrigation along with continuation of antibiotics.  Was seen by urology CBI was discontinued on 4/2 after patient had clear urine.  Urine cultures growing E. coli.  Sensitivities pending.  Patient will be discharged on total of 14 days of antibiotic after the sensitivities return.  Assessment and Plan: * Acute cystitis with hematuria SIRS/possible sepsis History of bladder stones s/p cystolitholopaxy  Patient presented with gross hematuria, has a low-grade temperature, tachycardia, leukocytosis CBI done in the ED with clearing of urine CT abdomen showing extensive  perivesicular inflammatory stranding -Underwent CBI which was discontinued on morning of 4/2. 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  No recent antibiotic use IV hydration Continue to monitor GI panel and stool differential if continuing  Gross hematuria Continue CBI Urology consulted Will keep n.p.o. in case of procedure  Type 2 diabetes mellitus with other diabetic neurological complication Sliding scale insulin coverage  HTN (hypertension) Hydralazine as needed  COPD (chronic obstructive pulmonary disease) Chronic respiratory failure with hypoxia Not acutely exacerbated and O2 requirement at baseline Continue home inhalers DuoNebs as needed        Subjective: Patient seen and examined this morning.  No overnight event.  Has been feeling better.  Foley discontinued today in the morning.  Post void pending..  Urine cultures pending for sensitivities.  Urine growing E. coli  Physical Exam: Vitals:   12/25/22 1610 12/25/22 2015 12/26/22 0520 12/26/22 0717  BP: (!) 155/93 (!) 144/83 (!) 142/84 122/85  Pulse: 92 97 (!) 58 86  Resp: 18 18 18 20   Temp: 99.5 F (37.5 C) 99.6 F (37.6 C) 97.8 F (36.6 C) 98.6 F (37 C)  TempSrc: Oral Oral    SpO2: 96% 95% 93% 97%  Weight:      Height:  Physical Exam Constitutional:      Appearance: Normal appearance.  HENT:     Head: Normocephalic and atraumatic.     Mouth/Throat:     Mouth: Mucous membranes are moist.  Eyes:     Pupils: Pupils are equal, round, and  reactive to light.  Cardiovascular:     Rate and Rhythm: Normal rate and regular rhythm.  Pulmonary:     Effort: Pulmonary effort is normal.  Abdominal:     General: Abdomen is flat.     Palpations: Abdomen is soft.  Musculoskeletal:        General: Normal range of motion.     Cervical back: Normal range of motion.  Skin:    General: Skin is warm.  Neurological:     General: No focal deficit present.     Mental Status: He is alert and oriented to person, place, and time. Mental status is at baseline.  Psychiatric:        Mood and Affect: Mood normal.        Behavior: Behavior normal.      Data Reviewed:  There are no new results to review at this time.  Family Communication: Wife updated about the status by bedside  Disposition: Status is: Inpatient Remains inpatient appropriate because: Gross hematuria and UTI  Planned Discharge Destination: Home    Time spent: 32 minutes  Author: Oran Rein, MD 12/26/2022 2:50 PM  For on call review [email or web address].

## 2022-12-26 NOTE — Progress Notes (Addendum)
Urology Inpatient Progress Note  Subjective: No acute events overnight.  He is afebrile, VSS. Procalcitonin up today, 0.31.  Urine cultures growing E. coli, susceptibilities pending.  Blood cultures pending with no growth at <24 hours. Foley catheter in place draining clear, yellow urine off CBI.  He reports some intermittent bladder spasms that are rather uncomfortable. They live in [place] and he wishes to follow-up with his primary urologist there.  Anti-infectives: Anti-infectives (From admission, onward)    Start     Dose/Rate Route Frequency Ordered Stop   12/26/22 0230  cefTRIAXone (ROCEPHIN) 2 g in sodium chloride 0.9 % 100 mL IVPB        2 g 200 mL/hr over 30 Minutes Intravenous Every 24 hours 12/25/22 0539 01/02/23 0229   12/25/22 0215  cefTRIAXone (ROCEPHIN) 2 g in sodium chloride 0.9 % 100 mL IVPB        2 g 200 mL/hr over 30 Minutes Intravenous  Once 12/25/22 0209 12/25/22 0525   12/25/22 0000  cephALEXin (KEFLEX) 500 MG capsule  Status:  Discontinued        500 mg Oral 3 times daily 12/25/22 0418 12/25/22        Current Facility-Administered Medications  Medication Dose Route Frequency Provider Last Rate Last Admin   acetaminophen (TYLENOL) tablet 650 mg  650 mg Oral Q6H PRN Athena Masse, MD       Or   acetaminophen (TYLENOL) suppository 650 mg  650 mg Rectal Q6H PRN Athena Masse, MD       cefTRIAXone (ROCEPHIN) 2 g in sodium chloride 0.9 % 100 mL IVPB  2 g Intravenous Q24H Judd Gaudier V, MD 200 mL/hr at 12/26/22 0207 2 g at 12/26/22 0207   Chlorhexidine Gluconate Cloth 2 % PADS 6 each  6 each Topical Q0600 Oran Rein, MD   6 each at 12/26/22 0530   HYDROcodone-acetaminophen (NORCO/VICODIN) 5-325 MG per tablet 1-2 tablet  1-2 tablet Oral Q4H PRN Athena Masse, MD   1 tablet at 12/25/22 1905   insulin aspart (novoLOG) injection 0-15 Units  0-15 Units Subcutaneous Q4H Judd Gaudier V, MD   2 Units at 12/26/22 0538   ipratropium-albuterol (DUONEB)  0.5-2.5 (3) MG/3ML nebulizer solution 3 mL  3 mL Nebulization Q4H PRN Athena Masse, MD       ondansetron [facility]) tablet 4 mg  4 mg Oral Q6H PRN Athena Masse, MD       Or   ondansetron [facility]) injection 4 mg  4 mg Intravenous Q6H PRN Athena Masse, MD       oxybutynin (DITROPAN) tablet 5 mg  5 mg Oral Q8H PRN McGowan, Shannon A, PA-C   5 mg at 12/26/22 0522   pantoprazole (PROTONIX) EC tablet 40 mg  40 mg Oral Daily Judd Gaudier V, MD   40 mg at 12/25/22 0929   simvastatin (ZOCOR) tablet 20 mg  20 mg Oral Daily Judd Gaudier V, MD   20 mg at 12/25/22 0929   sodium chloride irrigation 0.9 % 3,000 mL  3,000 mL Irrigation Continuous Vladimir Crofts, MD   3,000 mL at 12/25/22 0755   tamsulosin (FLOMAX) capsule 0.4 mg  0.4 mg Oral QHS Judd Gaudier V, MD   0.4 mg at 12/25/22 2111   tiotropium [facility]) inhalation capsule ([facility] use ONLY) 18 mcg  1 capsule Inhalation Daily Athena Masse, MD       Objective: Vital signs in last 24 hours: Temp:  [97.8 F (36.6  C)-99.6 F (37.6 C)] 98.6 F (37 C) (04/02 0717) Pulse Rate:  [58-98] 86 (04/02 0717) Resp:  [18-20] 20 (04/02 0717) BP: (122-161)/(78-93) 122/85 (04/02 0717) SpO2:  [93 %-97 %] 97 % (04/02 0717)  Intake/Output from previous day: 04/01 0701 - 04/02 0700 In: 600 [I.V.:600] Out: 12350 [Urine:12350] Intake/Output this shift: No intake/output data recorded.  Physical Exam Vitals and nursing note reviewed.  Constitutional:      General: He is not in acute distress.    Appearance: He is not ill-appearing, toxic-appearing or diaphoretic.  HENT:     Head: Normocephalic and atraumatic.  Pulmonary:     Effort: Pulmonary effort is normal. No respiratory distress.  Skin:    General: Skin is warm and dry.  Neurological:     Mental Status: He is alert and oriented to person, place, and time.  Psychiatric:        Mood and Affect: Mood normal.        Behavior: Behavior normal.     Lab Results:  Recent Labs    12/25/22 0131   WBC 13.1*  HGB 16.7  HCT 50.6  PLT 199   BMET Recent Labs    12/25/22 0131  NA 139  K 3.4*  CL 98  CO2 31  GLUCOSE 82  BUN 29*  CREATININE 1.28*  CALCIUM 9.4   PT/INR Recent Labs    12/26/22 0427  LABPROT 15.6*  INR 1.3*   Studies/Results: CT Renal Stone Study  Result Date: 12/25/2022 CLINICAL DATA:  Gross hematuria, urolithiasis EXAM: CT ABDOMEN AND PELVIS WITHOUT CONTRAST TECHNIQUE: Multidetector CT imaging of the abdomen and pelvis was performed following the standard protocol without IV contrast. RADIATION DOSE REDUCTION: This exam was performed according to the departmental dose-optimization program which includes automated exposure control, adjustment of the mA and/or kV according to patient size and/or use of iterative reconstruction technique. COMPARISON:  None Available. FINDINGS: Lower chest: No acute abnormality. Mild bibasilar parenchymal scarring. Extensive right coronary artery calcification. Small pericardial effusion. Cardiac size within normal limits. Hepatobiliary: Cholelithiasis without pericholecystic inflammatory change. Liver unremarkable. No intra or extrahepatic biliary ductal dilation. Pancreas: Unremarkable Spleen: Unremarkable Adrenals/Urinary Tract: The adrenal glands are unremarkable. The kidneys are normal on this noncontrast examination. The bladder is decompressed. There is superimposed circumferential bladder wall thickening and extensive perivesicular inflammatory stranding in keeping with a superimposed diffuse infectious or inflammatory cystitis. Stomach/Bowel: Severe distal transverse, descending, and sigmoid colonic diverticulosis without superimposed acute inflammatory change. The stomach, small bowel, and large bowel are otherwise unremarkable. Appendix normal. No free intraperitoneal gas or fluid. Vascular/Lymphatic: Extensive aortoiliac atherosclerotic calcification. Particularly prominent atherosclerotic calcification involving the proximal left  renal artery. No aortic aneurysm. A single pathologically enlarged right retrocrural lymph node is identified measuring 13 mm in short axis diameter at axial image # 19/2, nonspecific. No additional pathologic adenopathy within the abdomen and pelvis. Reproductive: Mild prostatic hypertrophy. Other: Moderate right and small left fat containing inguinal hernias. Musculoskeletal: Osseous structures are age-appropriate. No acute bone abnormality. No lytic or blastic bone lesion. IMPRESSION: 1. Circumferential bladder wall thickening and extensive perivesicular inflammatory stranding in keeping with a superimposed diffuse infectious or inflammatory cystitis. Correlation with urinalysis and urine culture may be helpful. No evidence of bladder outlet obstruction. No urolithiasis. No hydronephrosis. 2. Extensive right coronary artery calcification. Small pericardial effusion. 3. Cholelithiasis. 4. Severe distal colonic diverticulosis without superimposed acute inflammatory change. 5. Extensive aortoiliac atherosclerotic calcification. Particularly prominent atherosclerotic calcification involving the proximal left renal artery. If there  is clinical evidence of hemodynamically significant renal artery stenosis, CT arteriography may be helpful for further evaluation. 6. Bilateral fat containing inguinal hernias, right greater than left Aortic Atherosclerosis (ICD10-I70.0). Electronically Signed   By: Fidela Salisbury M.D.   On: 12/25/2022 02:38    Assessment & Plan: 72 year old male with PMH COPD on home oxygen at 2 L, B-cell lymphoma s/p CyberKnife in 2023, bladder stones s/p cystolitholapaxy, diabetes, hypertension, proteinuria, and OSA on CPAP currently admitted with suspected hemorrhagic cystitis.  Gross hematuria has resolved after CBI discontinuation yesterday.  He continues to have bladder spasms.  At this point, recommend Foley catheter discontinuation and he agreed.  I drained 30 cc of sterile water from the  Foley catheter balloon and removed the 20 [place] three-way coud hematuria catheter in its entirety.  Patient tolerated well.  We discussed that he will require outpatient hematuria workup.  He prefers to pursue this with his primary urologic team in [place].  I counseled him to reach out to them upon discharge to set up outpatient follow-up and he expressed understanding.  Recommendations: -Continue Flomax daily and oxybutynin as needed for bladder spasms (anticipate bladder spasms will resolve later today due to Foley removal) -Continue antibiotics and follow cultures.  He will require a total of 10 to 14 days of culture appropriate therapy. -Please obtain postvoid bladder scan around 1 PM today, sooner if unable to void and having pain.  Bladder scan order placed. -Patient needs to contact his primary urologist in [place] upon discharge to arrange outpatient follow-up; recommend hematuria workup with CT urogram and cystoscopy  Debroah Loop, PA-C 12/26/2022

## 2022-12-27 DIAGNOSIS — N3001 Acute cystitis with hematuria: Secondary | ICD-10-CM | POA: Diagnosis not present

## 2022-12-27 DIAGNOSIS — R600 Localized edema: Secondary | ICD-10-CM | POA: Diagnosis not present

## 2022-12-27 DIAGNOSIS — J9611 Chronic respiratory failure with hypoxia: Secondary | ICD-10-CM | POA: Diagnosis not present

## 2022-12-27 LAB — GLUCOSE, CAPILLARY
Glucose-Capillary: 158 mg/dL — ABNORMAL HIGH (ref 70–99)
Glucose-Capillary: 127 mg/dL — ABNORMAL HIGH (ref 70–99)
Glucose-Capillary: 238 mg/dL — ABNORMAL HIGH (ref 70–99)
Glucose-Capillary: 166 mg/dL — ABNORMAL HIGH (ref 70–99)
Glucose-Capillary: 232 mg/dL — ABNORMAL HIGH (ref 70–99)
Glucose-Capillary: 131 mg/dL — ABNORMAL HIGH (ref 70–99)

## 2022-12-27 LAB — URINE CULTURE: Culture: >=100000 — AB

## 2022-12-27 MED ORDER — PHENAZOPYRIDINE HCL 100 MG PO TABS
100.0000 mg | ORAL_TABLET | Freq: Three times a day (TID) | ORAL | Status: DC
  Administered 2022-12-27 – 2022-12-28 (×4): 100 mg via ORAL
  Filled 2022-12-27 (×5): qty 1

## 2022-12-27 MED ORDER — CIPROFLOXACIN HCL 500 MG PO TABS
500.0000 mg | ORAL_TABLET | Freq: Two times a day (BID) | ORAL | Status: DC
  Administered 2022-12-27 – 2022-12-28 (×2): 500 mg via ORAL
  Filled 2022-12-27 (×2): qty 1

## 2022-12-27 NOTE — Progress Notes (Signed)
. Progress Note   Patient: Dalton White [id number] DOB: [birth year] DOA: 12/25/2022     2 DOS: the patient was seen and examined on 12/27/2022   Brief hospital course: 72 y.o. 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  He denies fever and chills.  Patient was briefly on Pradaxa a month ago suspected A-fib that diagnosed during an ED visit however after follow-up with the cardiologist and wearing a Holter monitor it was not believed the abnormal rhythm was A-fib and Pradaxa was discontinued three weeks ago.  He is currently on aspirin only.  Patient denies fever, chills, vomiting, abdominal pain.  After arrival in the emergency room he developed diarrhea. ED course and data review: BP 185/92, temp 99.1 and pulse 104.  WBC 13,000, lactic acid pending  Creatinine 1.2 and slightly above his baseline.  Urinalysis with 0.2 potation due to hematuria but no bacteria was seen and few WBCs. CT renal stone study showing extensive perivesicular inflammatory stranding in keeping with diffuse infectious or inflammatory cystitis  1/4- 2/4: Patient underwent CBI irrigation along with continuation of antibiotics.  Was seen by urology CBI was discontinued on 4/2 after patient had clear urine.  Urine cultures growing E. coli.  Sensitivities pending.  Patient will be discharged on total of 14 days of antibiotic after the sensitivities return.  4/2 : Patient is symptomatic with some bladder discomfort and dysuria.  Added Pyridium, switched antibiotics from ceftriaxone to ciprofloxacin 500 mg twice daily.  Patient required for total of 14 days.  If patient stays asymptomatic with clear urine plan for discharge tomorrow   Assessment and  Plan: * Acute cystitis with hematuria SIRS/possible sepsis History of bladder stones s/p cystolitholopaxy  Patient presented with gross hematuria, has a low-grade temperature, tachycardia, leukocytosis CBI done in the ED with clearing of urine CT abdomen showing extensive perivesicular inflammatory stranding -Underwent CBI which was discontinued on morning of 4/2. 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  No recent antibiotic use IV hydration Continue to monitor GI panel and stool differential if continuing  Gross hematuria Continue CBI Urology consulted Will keep n.p.o. in case of procedure  Type 2 diabetes mellitus with other diabetic neurological complication Sliding scale insulin coverage  HTN (hypertension) Hydralazine as needed  COPD (chronic obstructive pulmonary disease) Chronic respiratory failure with hypoxia Not acutely exacerbated and O2 requirement at baseline Continue home inhalers DuoNebs as needed     Subjective: Patient seen and examined this morning.  No overnight event.  Complains of some discomfort/dysuria at the time of urination.  Urine clear.  Switched antibiotics from ceftriaxone to ciprofloxacin today.   Physical Exam: Vitals:   12/27/22 0455 12/27/22 0700 12/27/22 1102 12/27/22 1530  BP: (!) 149/93 (!) 176/91 (!) 150/92 (!) 161/88  Pulse: 81 76  81  Resp: 18 16  18   Temp: 98.5 F (  36.9 C) 98.3 F (36.8 C)  98.3 F (36.8 C)  TempSrc:  Oral    SpO2: 96% 96%  97%  Weight:       Height:      Physical Exam Constitutional:      Appearance: Normal appearance.  HENT:     Head: Normocephalic and atraumatic.     Mouth/Throat:     Mouth: Mucous membranes are moist.  Eyes:     Pupils: Pupils are equal, round, and reactive to light.  Cardiovascular:     Rate and Rhythm: Normal rate and regular rhythm.  Pulmonary:     Effort: Pulmonary effort is normal.  Abdominal:     General: Abdomen is flat.     Palpations: Abdomen is soft.  Musculoskeletal:        General: Normal range of motion.     Cervical back: Normal range of motion.  Skin:    General: Skin is warm.  Neurological:     General: No focal deficit present.     Mental Status: He is alert and oriented to person, place, and time. Mental status is at baseline.  Psychiatric:        Mood and Affect: Mood normal.        Behavior: Behavior normal.      Data Reviewed:  There are no new results to review at this time.  Family Communication: Wife updated about the status by bedside  Disposition: Status is: Inpatient Remains inpatient appropriate because: Gross hematuria and UTI  Planned Discharge Destination: Home    Time spent: 32 minutes  Author: Oran Rein, MD 12/27/2022 4:55 PM  For on call review [email or web address].

## 2022-12-27 NOTE — Progress Notes (Signed)
       CROSS COVER NOTE  NAME: CLOYS CORT MRN: [id number] DOB : 1951-02-27 ATTENDING PHYSICIAN: Oran Rein, MD    Date of Service   12/27/2022   HPI/Events of Note   Report/Request Notified of ongoing Hypertension and brief bradycardia tonight, HR as low as 27 with brisk return to rates in the 80s. Patient is asymptomatic.  On Review of chart 14 day Holter Monitor report from [facility] reviewed in [place]  "[place] 2/28 to 12/06/2022:  Patient had a min HR of 21 bpm, max HR of 132 bpm, and avg HR of 66 bpm. Predominant underlying rhythm was Sinus Rhythm.  First Degree AV Block was present. Bundle Branch Block/IVCD was present.  1 Pause occurred lasting 3.8 secs noted  (12/02/2022 at 9:55 am) . Pause occurred due to possible High Grade AV Block. 11 episode(s) of  brief AV Block (High Grade and 3rd) occurred, lasting a total of 1 min 19 secs. Above appeared asymptomatic.  Second Degree AV Block-Mobitz I (Wenckebach) was present. Wenckebach was detected within +/- 45 seconds of symptomatic patient event(s).  Isolated SVEs were rare (<1.0%), and no SVE Couplets or SVE Triplets were present. Isolated VEs were rare (<1.0%, 8096), VE Couplets were rare (<1.0%, 389), and VE Triplets were rare (<1.0%, 17). Ventricular Bigeminy was present.  Summary: Short episodes of advanced heart block present, appeared to be asymptomatic. "   Interventions   Assessment/Plan:  Monitor on telemetry      To reach the provider On-Call:   7AM- 7PM see care teams to locate the attending and reach out to them via [email or web address] Password: [email or web address] 7PM-7AM contact night-coverage If you still have difficulty reaching the appropriate provider, please page the [facility] (Director on Call) for [facility] on amion for assistance  This document was prepared using Systems analyst and may include unintentional dictation errors.  Neomia Glass DNP, MBA, FNP-BC, PMHNP-BC Nurse  Practitioner [facility] Pager [phone]

## 2022-12-28 DIAGNOSIS — N3001 Acute cystitis with hematuria: Secondary | ICD-10-CM | POA: Diagnosis not present

## 2022-12-28 LAB — BASIC METABOLIC PANEL
Anion gap: 10 (ref 5–15)
BUN: 18 mg/dL (ref 8–23)
CO2: 27 mmol/L (ref 22–32)
Calcium: 8.1 mg/dL — ABNORMAL LOW (ref 8.9–10.3)
Chloride: 98 mmol/L (ref 98–111)
Creatinine, Ser: 0.85 mg/dL (ref 0.61–1.24)
GFR, Estimated: >60 mL/min (ref >60)
Glucose, Bld: 251 mg/dL — ABNORMAL HIGH (ref 70–99)
Potassium: 3.1 mmol/L — ABNORMAL LOW (ref 3.5–5.1)
Sodium: 135 mmol/L (ref 135–145)

## 2022-12-28 LAB — CBC WITH DIFFERENTIAL/PLATELET
Abs Immature Granulocytes: 0.03 10*3/uL (ref 0.00–0.07)
Basophils Absolute: 0 10*3/uL (ref 0.0–0.1)
Basophils Relative: 1 %
Eosinophils Absolute: 0.2 10*3/uL (ref 0.0–0.5)
Eosinophils Relative: 2 %
HCT: 47.1 % (ref 39.0–52.0)
Hemoglobin: 15.3 g/dL (ref 13.0–17.0)
Immature Granulocytes: 0 %
Lymphocytes Relative: 13 %
Lymphs Abs: 0.8 10*3/uL (ref 0.7–4.0)
MCH: 29.7 pg (ref 26.0–34.0)
MCHC: 32.5 g/dL (ref 30.0–36.0)
MCV: 91.3 fL (ref 80.0–100.0)
Monocytes Absolute: 0.5 10*3/uL (ref 0.1–1.0)
Monocytes Relative: 8 %
Neutro Abs: 5.1 10*3/uL (ref 1.7–7.7)
Neutrophils Relative %: 76 %
Platelets: 177 10*3/uL (ref 150–400)
RBC: 5.16 MIL/uL (ref 4.22–5.81)
RDW: 13.6 % (ref 11.5–15.5)
WBC: 6.7 10*3/uL (ref 4.0–10.5)
nRBC: 0 % (ref 0.0–0.2)

## 2022-12-28 LAB — GLUCOSE, CAPILLARY
Glucose-Capillary: 130 mg/dL — ABNORMAL HIGH (ref 70–99)
Glucose-Capillary: 138 mg/dL — ABNORMAL HIGH (ref 70–99)
Glucose-Capillary: 224 mg/dL — ABNORMAL HIGH (ref 70–99)

## 2022-12-28 LAB — MAGNESIUM: Magnesium: 2 mg/dL (ref 1.7–2.4)

## 2022-12-28 MED ORDER — PHENAZOPYRIDINE HCL 100 MG PO TABS: 100.0000 mg | ORAL_TABLET | Freq: Three times a day (TID) | ORAL | 0 refills | Status: AC

## 2022-12-28 MED ORDER — POTASSIUM CHLORIDE CRYS ER 20 MEQ PO TBCR: 20.0000 meq | EXTENDED_RELEASE_TABLET | Freq: Every day | ORAL | 0 refills | Status: AC

## 2022-12-28 MED ORDER — CIPROFLOXACIN HCL 500 MG PO TABS: 500.0000 mg | ORAL_TABLET | Freq: Two times a day (BID) | ORAL | 0 refills | Status: AC

## 2022-12-28 MED ORDER — DOCUSATE SODIUM 100 MG PO CAPS: 100.0000 mg | ORAL_CAPSULE | Freq: Two times a day (BID) | ORAL | 0 refills | Status: AC | PRN

## 2022-12-28 MED ORDER — POLYETHYLENE GLYCOL 3350 17 G PO PACK
17.0000 g | PACK | Freq: Every day | ORAL | Status: DC
  Administered 2022-12-28: 17 g via ORAL
  Filled 2022-12-28: qty 1

## 2022-12-28 MED ORDER — POTASSIUM CHLORIDE CRYS ER 20 MEQ PO TBCR
40.0000 meq | EXTENDED_RELEASE_TABLET | Freq: Once | ORAL | Status: AC
  Administered 2022-12-28: 40 meq via ORAL
  Filled 2022-12-28: qty 2

## 2022-12-28 MED ORDER — OXYBUTYNIN CHLORIDE 5 MG PO TABS: 5.0000 mg | ORAL_TABLET | Freq: Three times a day (TID) | ORAL | 0 refills | Status: AC | PRN

## 2022-12-28 NOTE — Care Management Important Message (Signed)
Important Message  Patient Details  Name: Dalton White MRN: [id number] Date of Birth: 17-Apr-1951   Medicare Important Message Given:  Yes     [name] 12/28/2022, 3:12 PM

## 2022-12-28 NOTE — Plan of Care (Signed)
  Problem: Education: Goal: Knowledge of General Education information will improve Description: Including pain rating scale, medication(s)/side effects and non-pharmacologic comfort measures Outcome: Progressing   Problem: Clinical Measurements: Goal: Will remain free from infection Outcome: Progressing   

## 2022-12-28 NOTE — Progress Notes (Signed)
AVS given and reviewed with pt and wife at bedside. Medications discussed. Letter of clearance for pulmonary rehab by Barb Merino, MD given to pt. All questions answered to satisfaction. Pt verbalized understanding of information given. Pt escorted off the unit with all belongings via wheelchair by staff member.

## 2022-12-28 NOTE — Discharge Summary (Signed)
Physician Discharge Summary  Dalton White [id number] DOB: [birth year] DOA: 12/25/2022  PCP: System, Provider Not In  Admit date: 12/25/2022 Discharge date: 12/28/2022  Admitted From: Home Disposition: Home  Recommendations for Outpatient Follow-up:  Follow up with PCP in 1-2 weeks Schedule follow-up with urology in your local area.  Home Health: N/A Equipment/Devices: Already present at home.  Oxygen and CPAP dependent.  Discharge Condition: Fair CODE STATUS: Full code Diet recommendation: Low-salt and low-carb diet  Discharge summary: 72 year old with history of COPD on home oxygen, B-cell lymphoma status post CyberKnife treatment, bladder stones status post cystoscopy lithopexy, type 2 diabetes on oral hypoglycemics, hypertension, obstructive sleep apnea on CPAP presented to the emergency room with acute onset of gross hematuria, passage of clots and dysuria.  Was recently on Pradaxa but was taken off.  In the emergency room hemodynamically stable, creatinine 1.2.  Gross hematuria.  CT renal study with extensive perivesicular inflammatory stranding.  Admitted to the hospital.  Also followed by urology. Patient was treated with CBI irrigation, on antibiotics.  Urine culture with pansensitive E. coli.  He was given voiding trial and was successful.  Hematuria has resolved.  Continues to have occasional bladder discomfort and dysuria.  Acute cystitis with hematuria, history of bladder stone.  Sepsis present on admission secondary to UTI. Patient currently voiding normally.  No postvoid residual urine. He was on Rocephin and subsequently on ciprofloxacin.  Urology recommended 2 weeks of antibiotic therapy, he will continue 10 more days of ciprofloxacin. Pyridium for symptom control. He will also take oxybutynin as needed. Patient on terazosin at home that he will continue. Patient is from [place], he is traveling back and will follow-up with urology in his town.  Chronic medical  issues including Obstructive sleep apnea, on CPAP Type 2 diabetes, well-controlled.  On glipizide and Jardiance as well as metformin.  Jardiance is associated with high risk of infection especially UTI.  Will hold Jardiance until follow-up and clearance of infection. Essential hypertension, blood pressure well-controlled.  He can resume all his home medications.  He is on potassium supplementation as he is on hydrochlorothiazide.  His potassium was 3.1 today.  He was given additional potassium.  He will take prescription potassium 20 mill equivalent daily for 5 more days along with his home maintenance medication.  Stable for discharge.   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 May resume all other medications.  Take additional potassium prescribed as above   Increase activity slowly   Complete by: As directed       Allergies as of 12/28/2022       Reactions   Dabigatran    Upset stomach        Medication List     STOP taking these medications    ciclopirox 0.77 %  cream Commonly known as: LOPROX   cloNIDine 0.1 MG tablet Commonly known as: CATAPRES   Jardiance 25 MG Tabs tablet Generic drug: empagliflozin   loratadine 10 MG tablet Commonly known as: CLARITIN   pantoprazole 40 MG  tablet Commonly known as: PROTONIX   pioglitazone 15 MG tablet Commonly known as: ACTOS   Potassium 99 MG Tabs   Pradaxa 150 MG Caps capsule Generic drug: dabigatran   predniSONE 10 MG tablet Commonly known as: DELTASONE   tamsulosin 0.4 MG Caps capsule Commonly known as: FLOMAX       TAKE these medications    albuterol 108 (90 Base) MCG/ACT inhaler Commonly known as: VENTOLIN HFA Inhale 2 puffs into the lungs every 4 (four) hours as needed.   amLODipine 10 MG tablet Commonly known as: NORVASC Take 1 tablet by mouth daily.   aspirin EC 81 MG tablet Take 81 mg by mouth daily. Swallow whole.   azelastine 0.1 % nasal spray Commonly known as: ASTELIN Place 1 spray into both nostrils 2 (two) times daily.   ciprofloxacin 500 MG tablet Commonly known as: CIPRO Take 1 tablet (500 mg total) by mouth 2 (two) times daily for 10 days.   docusate sodium 100 MG capsule Commonly known as: Colace Take 1 capsule (100 mg total) by mouth 2 (two) times daily as needed for mild constipation.   famotidine 40 MG tablet Commonly known as: PEPCID Take 1 tablet by mouth 2 (two) times daily.   ferrous sulfate 325 (65 FE) MG tablet Take 325 mg by mouth 2 (two) times daily with a meal.   fluticasone 50 MCG/ACT nasal spray Commonly known as: FLONASE Place 1-2 sprays into both nostrils daily.   fluticasone-salmeterol 500-50 MCG/ACT Aepb Commonly known as: ADVAIR Inhale 1 puff into the lungs 2 (two) times daily.   furosemide 20 MG tablet Commonly known as: LASIX Take 1 tablet by mouth every morning.   glipiZIDE 5 MG tablet Commonly known as: GLUCOTROL Take 5-10 tablets by mouth See admin instructions. Take 2 tablets (10mg ) by mouth every morning and take 1 tablet (5mg ) by mouth every evening   guaifenesin 400 MG Tabs tablet Commonly known as: HUMIBID E Take 400-800 mg by mouth every 6 (six) hours as needed (congestion or phlegm).   ipratropium 0.03 % nasal spray Commonly  known as: ATROVENT Place 2 sprays into both nostrils in the morning, at noon, and at bedtime.   ipratropium-albuterol 0.5-2.5 (3) MG/3ML Soln Commonly known as: DUONEB Take 3 mLs by nebulization every 4 (four) hours as needed.   lisinopril 40 MG tablet Commonly known as: ZESTRIL Take 1 tablet by mouth 2 (two) times daily.   metFORMIN 850 MG tablet Commonly known as: GLUCOPHAGE Take 850 mg by mouth in the morning, at noon, and at bedtime. Take 2 tablets (1700mg ) by mouth every morning and take 1 tablet (850mg ) by mouth every evening   montelukast 10 MG tablet Commonly known as: SINGULAIR Take 10 mg by mouth at bedtime.   Omega 3 500 500 MG Caps Take 500 mg by mouth daily.   oxybutynin 5 MG tablet Commonly known as: DITROPAN Take 1 tablet (5 mg total) by mouth every 8 (eight) hours as needed for up to 7 days for bladder spasms.   phenazopyridine 100 MG tablet Commonly known as: PYRIDIUM Take 1 tablet (100 mg total) by mouth 3 (three) times daily with meals.   potassium chloride SA 20 MEQ tablet Commonly known as: KLOR-CON M Take 1 tablet (20  mEq total) by mouth daily for 5 doses.   potassium citrate 10 MEQ (1080 MG) SR tablet Commonly known as: UROCIT-K Take by mouth.   simvastatin 20 MG tablet Commonly known as: ZOCOR Take 20 mg by mouth daily.   terazosin 2 MG capsule Commonly known as: HYTRIN Take by mouth.   Tiotropium Bromide Monohydrate 2.5 MCG/ACT Aers Inhale 2 puffs into the lungs daily.   triamterene-hydrochlorothiazide 37.5-25 MG tablet Commonly known as: MAXZIDE-25 Take 1 tablet by mouth daily.        Follow-up Information     Hollice Espy, MD.   Specialty: Urology Why: Local Urologist Contact information: [place] [id number] [phone]         [facility] Emergency Department at [facility].   Specialty: Emergency Medicine Why: As needed, If symptoms worsen Contact information: [place] [id number] [place] [address] [phone]               Allergies  Allergen Reactions   Dabigatran     Upset stomach    Consultations: Urology   Procedures/Studies: CT Renal Stone Study  Result Date: 12/25/2022 CLINICAL DATA:  Gross hematuria, urolithiasis EXAM: CT ABDOMEN AND PELVIS WITHOUT CONTRAST TECHNIQUE: Multidetector CT imaging of the abdomen and pelvis was performed following the standard protocol without IV contrast. RADIATION DOSE REDUCTION: This exam was performed according to the departmental dose-optimization program which includes automated exposure control, adjustment of the mA and/or kV according to patient size and/or use of iterative reconstruction technique. COMPARISON:  None Available. FINDINGS: Lower chest: No acute abnormality. Mild bibasilar parenchymal scarring. Extensive right coronary artery calcification. Small pericardial effusion. Cardiac size within normal limits. Hepatobiliary: Cholelithiasis without pericholecystic inflammatory change. Liver unremarkable. No intra or extrahepatic biliary ductal dilation. Pancreas: Unremarkable Spleen: Unremarkable Adrenals/Urinary Tract: The adrenal glands are unremarkable. The kidneys are normal on this noncontrast examination. The bladder is decompressed. There is superimposed circumferential bladder wall thickening and extensive perivesicular inflammatory stranding in keeping with a superimposed diffuse infectious or inflammatory cystitis. Stomach/Bowel: Severe distal transverse, descending, and sigmoid colonic diverticulosis without superimposed acute inflammatory change. The stomach, small bowel, and large bowel are otherwise unremarkable. Appendix normal. No free intraperitoneal gas or fluid. Vascular/Lymphatic: Extensive aortoiliac atherosclerotic calcification. Particularly prominent atherosclerotic calcification involving the proximal left renal artery. No aortic aneurysm. A single  pathologically enlarged right retrocrural lymph node is identified measuring 13 mm in short axis diameter at axial image # 19/2, nonspecific. No additional pathologic adenopathy within the abdomen and pelvis. Reproductive: Mild prostatic hypertrophy. Other: Moderate right and small left fat containing inguinal hernias. Musculoskeletal: Osseous structures are age-appropriate. No acute bone abnormality. No lytic or blastic bone lesion. IMPRESSION: 1. Circumferential bladder wall thickening and extensive perivesicular inflammatory stranding in keeping with a superimposed diffuse infectious or inflammatory cystitis. Correlation with urinalysis and urine culture may be helpful. No evidence of bladder outlet obstruction. No urolithiasis. No hydronephrosis. 2. Extensive right coronary artery calcification. Small pericardial effusion. 3. Cholelithiasis. 4. Severe distal colonic diverticulosis without superimposed acute inflammatory change. 5. Extensive aortoiliac atherosclerotic calcification. Particularly prominent atherosclerotic calcification involving the proximal left renal artery. If there is clinical evidence of hemodynamically significant renal artery stenosis, CT arteriography may be helpful for further evaluation. 6. Bilateral fat containing inguinal hernias, right greater than left Aortic Atherosclerosis (ICD10-I70.0). Electronically Signed   By: Fidela Salisbury M.D.   On: 12/25/2022 02:38   (Echo, Carotid, EGD, Colonoscopy, ERCP)    Subjective: Patient  was seen and examined.  Still has occasional catchy discomfort when urinating.  He does not have any postvoid residual urine.  Urine is mostly clear with orange color due to Pyridium.  Remains afebrile.   Discharge Exam: Vitals:   12/28/22 0618 12/28/22 0747  BP: (!) 153/101 (!) 167/97  Pulse: 84 83  Resp: 18 18  Temp: 97.7 F (36.5 C) 98.2 F (36.8 C)  SpO2: 98% 97%   Vitals:   12/27/22 2112 12/28/22 0618 12/28/22 0747 12/28/22 1208  BP:  (!)  153/101 (!) 167/97   Pulse: 60 84 83   Resp:  18 18   Temp:  97.7 F (36.5 C) 98.2 F (36.8 C)   TempSrc:      SpO2:  98% 97%   Weight:    112.9 kg  Height:        General: Pt is alert, awake, not in acute distress Cardiovascular: RRR, S1/S2 +, no rubs, no gallops Respiratory: CTA bilaterally, no wheezing, no rhonchi Abdominal: Soft, NT, ND, bowel sounds + Extremities: no edema, no cyanosis    The results of significant diagnostics from this hospitalization (including imaging, microbiology, ancillary and laboratory) are listed below for reference.     Microbiology: Recent Results (from the past 240 hour(s))  Urine Culture (for pregnant, neutropenic or urologic patients or patients with an indwelling urinary catheter)     Status: Abnormal   Collection Time: 12/25/22  1:44 AM   Specimen: Urine, Clean Catch  Result Value Ref Range Status   Specimen Description   Final    URINE, CLEAN CATCH Performed at [facility], [address] [place], [place] [address]    Special Requests   Final    NONE Performed at [facility], [address] [place], [place] [address]    Culture >=100,000 COLONIES/mL ESCHERICHIA COLI (A)  Final   Report Status 12/27/2022 FINAL  Final   Organism ID, Bacteria ESCHERICHIA COLI (A)  Final      Susceptibility   Escherichia coli - MIC*    AMPICILLIN <=2 SENSITIVE Sensitive     CEFAZOLIN <=4 SENSITIVE Sensitive     CEFEPIME <=0.12 SENSITIVE Sensitive     CEFTRIAXONE <=0.25 SENSITIVE Sensitive     CIPROFLOXACIN <=0.25 SENSITIVE Sensitive     GENTAMICIN <=1 SENSITIVE Sensitive     IMIPENEM <=0.25 SENSITIVE Sensitive     NITROFURANTOIN 64 INTERMEDIATE Intermediate     TRIMETH/SULFA <=20 SENSITIVE Sensitive     AMPICILLIN/SULBACTAM <=2 SENSITIVE Sensitive     PIP/TAZO <=4 SENSITIVE Sensitive     * >=100,000 COLONIES/mL ESCHERICHIA COLI  Blood culture (routine x 2)     Status: None (Preliminary result)   Collection Time: 12/25/22   6:20 AM   Specimen: BLOOD  Result Value Ref Range Status   Specimen Description BLOOD LEFT AC  Final   Special Requests   Final    BOTTLES DRAWN AEROBIC AND ANAEROBIC Blood Culture adequate volume   Culture   Final    NO GROWTH 2 DAYS Performed at [facility], [address] [place], [place] [address]    Report Status PENDING  Incomplete  Blood culture (routine x 2)     Status: None (Preliminary result)   Collection Time: 12/25/22  6:20 AM   Specimen: BLOOD  Result Value Ref Range Status   Specimen Description BLOOD RIGHT AC  Final   Special Requests   Final    BOTTLES DRAWN AEROBIC AND ANAEROBIC Blood Culture adequate volume  Culture   Final    NO GROWTH 2 DAYS Performed at [facility], [address] [place], [place] [address]    Report Status PENDING  Incomplete     Labs: BNP (last 3 results) No results for input(s): "BNP" in the last 8760 hours. Basic Metabolic Panel: Recent Labs  Lab 12/25/22 0131 12/28/22 1105  NA 139 135  K 3.4* 3.1*  CL 98 98  CO2 31 27  GLUCOSE 82 251*  BUN 29* 18  CREATININE 1.28* 0.85  CALCIUM 9.4 8.1*  MG  --  2.0   Liver Function Tests: No results for input(s): "AST", "ALT", "ALKPHOS", "BILITOT", "PROT", "ALBUMIN" in the last 168 hours. No results for input(s): "LIPASE", "AMYLASE" in the last 168 hours. No results for input(s): "AMMONIA" in the last 168 hours. CBC: Recent Labs  Lab 12/25/22 0131 12/28/22 1105  WBC 13.1* 6.7  NEUTROABS 10.9* 5.1  HGB 16.7 15.3  HCT 50.6 47.1  MCV 90.7 91.3  PLT 199 177   Cardiac Enzymes: No results for input(s): "CKTOTAL", "CKMB", "CKMBINDEX", "TROPONINI" in the last 168 hours. BNP: Invalid input(s): "POCBNP" CBG: Recent Labs  Lab 12/27/22 2032 12/27/22 2306 12/28/22 0529 12/28/22 0748 12/28/22 1159  GLUCAP 232* 131* 130* 138* 224*   D-Dimer No results for input(s): "DDIMER" in the last 72 hours. Hgb A1c No results for input(s): "HGBA1C" in the last 72  hours. Lipid Profile No results for input(s): "CHOL", "HDL", "LDLCALC", "TRIG", "CHOLHDL", "LDLDIRECT" in the last 72 hours. Thyroid function studies No results for input(s): "TSH", "T4TOTAL", "T3FREE", "THYROIDAB" in the last 72 hours.  Invalid input(s): "FREET3" Anemia work up No results for input(s): "VITAMINB12", "FOLATE", "FERRITIN", "TIBC", "IRON", "RETICCTPCT" in the last 72 hours. Urinalysis    Component Value Date/Time   COLORURINE RED (A) 12/25/2022 0131   APPEARANCEUR TURBID (A) 12/25/2022 0131   LABSPEC 1.017 12/25/2022 0131   PHURINE  12/25/2022 0131    TEST NOT REPORTED DUE TO COLOR INTERFERENCE OF URINE PIGMENT   GLUCOSEU (A) 12/25/2022 0131    TEST NOT REPORTED DUE TO COLOR INTERFERENCE OF URINE PIGMENT   HGBUR (A) 12/25/2022 0131    TEST NOT REPORTED DUE TO COLOR INTERFERENCE OF URINE PIGMENT   BILIRUBINUR (A) 12/25/2022 0131    TEST NOT REPORTED DUE TO COLOR INTERFERENCE OF URINE PIGMENT   KETONESUR (A) 12/25/2022 0131    TEST NOT REPORTED DUE TO COLOR INTERFERENCE OF URINE PIGMENT   PROTEINUR (A) 12/25/2022 0131    TEST NOT REPORTED DUE TO COLOR INTERFERENCE OF URINE PIGMENT   NITRITE (A) 12/25/2022 0131    TEST NOT REPORTED DUE TO COLOR INTERFERENCE OF URINE PIGMENT   LEUKOCYTESUR (A) 12/25/2022 0131    TEST NOT REPORTED DUE TO COLOR INTERFERENCE OF URINE PIGMENT   Sepsis Labs Recent Labs  Lab 12/25/22 0131 12/28/22 1105  WBC 13.1* 6.7   Microbiology Recent Results (from the past 240 hour(s))  Urine Culture (for pregnant, neutropenic or urologic patients or patients with an indwelling urinary catheter)     Status: Abnormal   Collection Time: 12/25/22  1:44 AM   Specimen: Urine, Clean Catch  Result Value Ref Range Status   Specimen Description   Final    URINE, CLEAN CATCH Performed at [facility], [address] [place], [place] [address]    Special Requests   Final    NONE Performed at [facility], [address],  [place], [place] [address]    Culture >=100,000 COLONIES/mL  ESCHERICHIA COLI (A)  Final   Report Status 12/27/2022 FINAL  Final   Organism ID, Bacteria ESCHERICHIA COLI (A)  Final      Susceptibility   Escherichia coli - MIC*    AMPICILLIN <=2 SENSITIVE Sensitive     CEFAZOLIN <=4 SENSITIVE Sensitive     CEFEPIME <=0.12 SENSITIVE Sensitive     CEFTRIAXONE <=0.25 SENSITIVE Sensitive     CIPROFLOXACIN <=0.25 SENSITIVE Sensitive     GENTAMICIN <=1 SENSITIVE Sensitive     IMIPENEM <=0.25 SENSITIVE Sensitive     NITROFURANTOIN 64 INTERMEDIATE Intermediate     TRIMETH/SULFA <=20 SENSITIVE Sensitive     AMPICILLIN/SULBACTAM <=2 SENSITIVE Sensitive     PIP/TAZO <=4 SENSITIVE Sensitive     * >=100,000 COLONIES/mL ESCHERICHIA COLI  Blood culture (routine x 2)     Status: None (Preliminary result)   Collection Time: 12/25/22  6:20 AM   Specimen: BLOOD  Result Value Ref Range Status   Specimen Description BLOOD LEFT AC  Final   Special Requests   Final    BOTTLES DRAWN AEROBIC AND ANAEROBIC Blood Culture adequate volume   Culture   Final    NO GROWTH 2 DAYS Performed at [facility], [address] [place], [place] [address]    Report Status PENDING  Incomplete  Blood culture (routine x 2)     Status: None (Preliminary result)   Collection Time: 12/25/22  6:20 AM   Specimen: BLOOD  Result Value Ref Range Status   Specimen Description BLOOD RIGHT [facility]  Final   Special Requests   Final    BOTTLES DRAWN AEROBIC AND ANAEROBIC Blood Culture adequate volume   Culture   Final    NO GROWTH 2 DAYS Performed at [facility], [address], [place], [place] [address]    Report Status PENDING  Incomplete     Time coordinating discharge: 35 minutes  SIGNED:   Barb Merino, MD  [facility] 12/28/2022, 12:50 PM

## 2022-12-28 NOTE — Inpatient Diabetes Management (Signed)
Inpatient Diabetes Program Recommendations  AACE/ADA: New Consensus Statement on Inpatient Glycemic Control (2015)  Target Ranges:  Prepandial:   less than 140 mg/dL      Peak postprandial:   less than 180 mg/dL (1-2 hours)      Critically ill patients:  140 - 180 mg/dL   Lab Results  Component Value Date   GLUCAP 224 (H) 12/28/2022   HGBA1C 6.1 (H) 12/25/2022    Review of Glycemic Control  Latest Reference Range & Units 12/28/22 07:48 12/28/22 11:59  Glucose-Capillary 70 - 99 mg/dL 138 (H) 224 (H)  (H): Data is abnormally high  Diabetes history: DM2 Outpatient Diabetes medications: Glipizide 10 mg QAM, 5 mg QPM Current orders for Inpatient glycemic control: Novolog 0-15 units Q4H  Inpatient Diabetes Program Recommendations:   Patient is eating.  Might consider:  Novolog 0-9 units TID and 0-5 units QHS Novolog 2 units TID with meals if he consumes at least 50%  Will continue to follow while inpatient.  Thank you, Reche Dixon, MSN, [place] Diabetes Coordinator Inpatient Diabetes Program [phone] (team pager from 8a-5p)

## 2022-12-30 LAB — CULTURE, BLOOD (ROUTINE X 2)
Culture: NO GROWTH
Culture: NO GROWTH
Special Requests: ADEQUATE
Special Requests: ADEQUATE

## 2023-11-08 ENCOUNTER — Other Ambulatory Visit (FREE_STANDING_LABORATORY_FACILITY): Payer: Self-pay | Admitting: Internal Medicine

## 2023-11-08 LAB — BASIC METABOLIC PANEL
Anion Gap: 8 (ref 5.0–15.0)
BUN: 18 mg/dL (ref 9–28)
CO2: 35 meq/L — ABNORMAL HIGH (ref 17–29)
Calcium: 7.5 mg/dL — ABNORMAL LOW (ref 7.9–10.2)
Chloride: 97 meq/L — ABNORMAL LOW (ref 99–111)
Creatinine: 0.9 mg/dL (ref 0.5–1.5)
GFR: >60 mL/min/{1.73_m2} (ref >=60.0)
Glucose: 125 mg/dL — ABNORMAL HIGH (ref 70–100)
Hemolysis Index: 5 {index}
Potassium: 3.7 meq/L (ref 3.5–5.3)
Sodium: 140 meq/L (ref 135–145)

## 2023-11-08 LAB — CBC
Absolute nRBC: 0 10*3/uL (ref <=0.00)
Hematocrit: 28.1 % — ABNORMAL LOW (ref 37.6–49.6)
Hemoglobin: 8.3 g/dL — ABNORMAL LOW (ref 12.5–17.1)
MCH: 25.9 pg (ref 25.1–33.5)
MCHC: 29.5 g/dL — ABNORMAL LOW (ref 31.5–35.8)
MCV: 87.8 fL (ref 78.0–96.0)
MPV: 9.9 fL (ref 8.9–12.5)
Platelet Count: 306 10*3/uL (ref 142–346)
RBC: 3.2 10*6/uL — ABNORMAL LOW (ref 4.20–5.90)
RDW: 17 % — ABNORMAL HIGH (ref 11–15)
WBC: 7.44 10*3/uL (ref 3.10–9.50)
nRBC %: 0 /100{WBCs} (ref <=0.0)

## 2023-11-12 ENCOUNTER — Other Ambulatory Visit (FREE_STANDING_LABORATORY_FACILITY): Payer: Self-pay | Admitting: Internal Medicine

## 2023-11-12 LAB — BASIC METABOLIC PANEL
Anion Gap: 9 (ref 5.0–15.0)
BUN: 17 mg/dL (ref 9–28)
CO2: 36 meq/L — ABNORMAL HIGH (ref 17–29)
Calcium: 8 mg/dL (ref 7.9–10.2)
Chloride: 97 meq/L — ABNORMAL LOW (ref 99–111)
Creatinine: 0.9 mg/dL (ref 0.5–1.5)
GFR: >60 mL/min/{1.73_m2} (ref >=60.0)
Glucose: 119 mg/dL — ABNORMAL HIGH (ref 70–100)
Hemolysis Index: 4 {index}
Potassium: 3.6 meq/L (ref 3.5–5.3)
Sodium: 142 meq/L (ref 135–145)

## 2023-11-12 LAB — CBC
Absolute nRBC: 0 10*3/uL (ref <=0.00)
Hematocrit: 29.2 % — ABNORMAL LOW (ref 37.6–49.6)
Hemoglobin: 8.5 g/dL — ABNORMAL LOW (ref 12.5–17.1)
MCH: 25.4 pg (ref 25.1–33.5)
MCHC: 29.1 g/dL — ABNORMAL LOW (ref 31.5–35.8)
MCV: 87.4 fL (ref 78.0–96.0)
MPV: 10.3 fL (ref 8.9–12.5)
Platelet Count: 273 10*3/uL (ref 142–346)
RBC: 3.34 10*6/uL — ABNORMAL LOW (ref 4.20–5.90)
RDW: 17 % — ABNORMAL HIGH (ref 11–15)
WBC: 8.14 10*3/uL (ref 3.10–9.50)
nRBC %: 0 /100{WBCs} (ref <=0.0)

## 2023-11-15 ENCOUNTER — Other Ambulatory Visit (FREE_STANDING_LABORATORY_FACILITY): Payer: Self-pay | Admitting: Internal Medicine

## 2023-11-15 LAB — CBC
Absolute nRBC: 0 10*3/uL (ref <=0.00)
Hematocrit: 28.9 % — ABNORMAL LOW (ref 37.6–49.6)
Hemoglobin: 8.6 g/dL — ABNORMAL LOW (ref 12.5–17.1)
MCH: 25.4 pg (ref 25.1–33.5)
MCHC: 29.8 g/dL — ABNORMAL LOW (ref 31.5–35.8)
MCV: 85.3 fL (ref 78.0–96.0)
MPV: 10.1 fL (ref 8.9–12.5)
Platelet Count: 275 10*3/uL (ref 142–346)
RBC: 3.39 10*6/uL — ABNORMAL LOW (ref 4.20–5.90)
RDW: 17 % — ABNORMAL HIGH (ref 11–15)
WBC: 6.67 10*3/uL (ref 3.10–9.50)
nRBC %: 0 /100{WBCs} (ref <=0.0)

## 2023-11-15 LAB — BASIC METABOLIC PANEL
Anion Gap: 11 (ref 5.0–15.0)
BUN: 15 mg/dL (ref 9–28)
CO2: 35 meq/L — ABNORMAL HIGH (ref 17–29)
Calcium: 7.6 mg/dL — ABNORMAL LOW (ref 7.9–10.2)
Chloride: 97 meq/L — ABNORMAL LOW (ref 99–111)
Creatinine: 0.8 mg/dL (ref 0.5–1.5)
GFR: >60 mL/min/{1.73_m2} (ref >=60.0)
Glucose: 103 mg/dL — ABNORMAL HIGH (ref 70–100)
Hemolysis Index: 5 {index}
Potassium: 3.2 meq/L — ABNORMAL LOW (ref 3.5–5.3)
Sodium: 143 meq/L (ref 135–145)

## 2023-11-19 ENCOUNTER — Other Ambulatory Visit (FREE_STANDING_LABORATORY_FACILITY): Payer: Self-pay | Admitting: Internal Medicine

## 2023-11-19 LAB — CBC
Absolute nRBC: 0 10*3/uL (ref <=0.00)
Hematocrit: 28.3 % — ABNORMAL LOW (ref 37.6–49.6)
Hemoglobin: 8.6 g/dL — ABNORMAL LOW (ref 12.5–17.1)
MCH: 25.6 pg (ref 25.1–33.5)
MCHC: 30.4 g/dL — ABNORMAL LOW (ref 31.5–35.8)
MCV: 84.2 fL (ref 78.0–96.0)
MPV: 10.1 fL (ref 8.9–12.5)
Platelet Count: 280 10*3/uL (ref 142–346)
RBC: 3.36 10*6/uL — ABNORMAL LOW (ref 4.20–5.90)
RDW: 16 % — ABNORMAL HIGH (ref 11–15)
WBC: 5.74 10*3/uL (ref 3.10–9.50)
nRBC %: 0 /100{WBCs} (ref <=0.0)

## 2023-11-19 LAB — BASIC METABOLIC PANEL
Anion Gap: 12 (ref 5.0–15.0)
BUN: 25 mg/dL (ref 9–28)
CO2: 40 meq/L — ABNORMAL HIGH (ref 17–29)
Calcium: 8 mg/dL (ref 7.9–10.2)
Chloride: 91 meq/L — ABNORMAL LOW (ref 99–111)
Creatinine: 1 mg/dL (ref 0.5–1.5)
GFR: >60 mL/min/{1.73_m2} (ref >=60.0)
Glucose: 107 mg/dL — ABNORMAL HIGH (ref 70–100)
Hemolysis Index: 9 {index}
Potassium: 2.9 meq/L — ABNORMAL LOW (ref 3.5–5.3)
Sodium: 143 meq/L (ref 135–145)

## 2023-11-19 LAB — MAGNESIUM: Magnesium: 1.2 mg/dL — ABNORMAL LOW (ref 1.6–2.6)

## 2023-11-20 ENCOUNTER — Ambulatory Visit: Payer: 59 | Attending: Internal Medicine | Admitting: Internal Medicine

## 2023-11-20 ENCOUNTER — Other Ambulatory Visit: Payer: Self-pay | Admitting: Internal Medicine

## 2023-11-20 LAB — COVID-19 (SARS-COV-2) AND INFLUENZA A/B AND RSV
Influenza A RNA: POSITIVE — AB
Influenza B RNA: NEGATIVE
Respiratory Syncytial Virus RNA: NEGATIVE
SARS-CoV-2 (COVID-19) RNA: NEGATIVE

## 2023-11-22 ENCOUNTER — Other Ambulatory Visit (FREE_STANDING_LABORATORY_FACILITY): Payer: Self-pay | Admitting: Internal Medicine

## 2023-11-22 LAB — BASIC METABOLIC PANEL
Anion Gap: 11 (ref 5.0–15.0)
BUN: 30 mg/dL — ABNORMAL HIGH (ref 9–28)
CO2: 44 meq/L — ABNORMAL HIGH (ref 17–29)
Calcium: 8.2 mg/dL (ref 7.9–10.2)
Chloride: 89 meq/L — ABNORMAL LOW (ref 99–111)
Creatinine: 0.9 mg/dL (ref 0.5–1.5)
GFR: >60 mL/min/{1.73_m2} (ref >=60.0)
Glucose: 100 mg/dL (ref 70–100)
Hemolysis Index: 2 {index}
Potassium: 3.2 meq/L — ABNORMAL LOW (ref 3.5–5.3)
Sodium: 144 meq/L (ref 135–145)

## 2023-11-22 LAB — CBC
Absolute nRBC: 0 10*3/uL (ref <=0.00)
Hematocrit: 28.3 % — ABNORMAL LOW (ref 37.6–49.6)
Hemoglobin: 8.7 g/dL — ABNORMAL LOW (ref 12.5–17.1)
MCH: 25.4 pg (ref 25.1–33.5)
MCHC: 30.7 g/dL — ABNORMAL LOW (ref 31.5–35.8)
MCV: 82.7 fL (ref 78.0–96.0)
MPV: 10.2 fL (ref 8.9–12.5)
Platelet Count: 268 10*3/uL (ref 142–346)
RBC: 3.42 10*6/uL — ABNORMAL LOW (ref 4.20–5.90)
RDW: 16 % — ABNORMAL HIGH (ref 11–15)
WBC: 5.38 10*3/uL (ref 3.10–9.50)
nRBC %: 0 /100{WBCs} (ref <=0.0)

## 2023-11-22 LAB — MAGNESIUM: Magnesium: 1.2 mg/dL — ABNORMAL LOW (ref 1.6–2.6)

## 2023-11-26 ENCOUNTER — Other Ambulatory Visit (FREE_STANDING_LABORATORY_FACILITY): Payer: Self-pay | Admitting: Internal Medicine

## 2023-11-26 LAB — BASIC METABOLIC PANEL
Anion Gap: 14 (ref 5.0–15.0)
BUN: 26 mg/dL (ref 9–28)
CO2: 38 meq/L — ABNORMAL HIGH (ref 17–29)
Calcium: 7.9 mg/dL (ref 7.9–10.2)
Chloride: 95 meq/L — ABNORMAL LOW (ref 99–111)
Creatinine: 0.8 mg/dL (ref 0.5–1.5)
GFR: >60 mL/min/{1.73_m2} (ref >=60.0)
Glucose: 138 mg/dL — ABNORMAL HIGH (ref 70–100)
Hemolysis Index: 4 {index}
Potassium: 3.6 meq/L (ref 3.5–5.3)
Sodium: 147 meq/L — ABNORMAL HIGH (ref 135–145)

## 2023-11-26 LAB — CBC
Absolute nRBC: 0 10*3/uL (ref <=0.00)
Hematocrit: 29.8 % — ABNORMAL LOW (ref 37.6–49.6)
Hemoglobin: 8.7 g/dL — ABNORMAL LOW (ref 12.5–17.1)
MCH: 24.7 pg — ABNORMAL LOW (ref 25.1–33.5)
MCHC: 29.2 g/dL — ABNORMAL LOW (ref 31.5–35.8)
MCV: 84.7 fL (ref 78.0–96.0)
MPV: 10.5 fL (ref 8.9–12.5)
Platelet Count: 283 10*3/uL (ref 142–346)
RBC: 3.52 10*6/uL — ABNORMAL LOW (ref 4.20–5.90)
RDW: 16 % — ABNORMAL HIGH (ref 11–15)
WBC: 7.3 10*3/uL (ref 3.10–9.50)
nRBC %: 0 /100{WBCs} (ref <=0.0)

## 2023-11-26 LAB — MAGNESIUM: Magnesium: 1.5 mg/dL — ABNORMAL LOW (ref 1.6–2.6)

## 2023-11-29 ENCOUNTER — Other Ambulatory Visit (FREE_STANDING_LABORATORY_FACILITY)

## 2023-11-29 DIAGNOSIS — I2510 Atherosclerotic heart disease of native coronary artery without angina pectoris: Secondary | ICD-10-CM

## 2023-11-29 DIAGNOSIS — I10 Essential (primary) hypertension: Secondary | ICD-10-CM

## 2023-11-29 LAB — CBC
Absolute nRBC: 0 10*3/uL (ref <=0.00)
Hematocrit: 28.3 % — ABNORMAL LOW (ref 37.6–49.6)
Hemoglobin: 8.5 g/dL — ABNORMAL LOW (ref 12.5–17.1)
MCH: 25.1 pg (ref 25.1–33.5)
MCHC: 30 g/dL — ABNORMAL LOW (ref 31.5–35.8)
MCV: 83.7 fL (ref 78.0–96.0)
MPV: 10.5 fL (ref 8.9–12.5)
Platelet Count: 278 10*3/uL (ref 142–346)
RBC: 3.38 10*6/uL — ABNORMAL LOW (ref 4.20–5.90)
RDW: 16 % — ABNORMAL HIGH (ref 11–15)
WBC: 6.56 10*3/uL (ref 3.10–9.50)
nRBC %: 0 /100{WBCs} (ref <=0.0)

## 2023-11-29 LAB — BASIC METABOLIC PANEL
Anion Gap: 13 (ref 5.0–15.0)
BUN: 23 mg/dL (ref 9–28)
CO2: 33 meq/L — ABNORMAL HIGH (ref 17–29)
Calcium: 8.2 mg/dL (ref 7.9–10.2)
Chloride: 98 meq/L — ABNORMAL LOW (ref 99–111)
Creatinine: 0.9 mg/dL (ref 0.5–1.5)
GFR: >60 mL/min/{1.73_m2} (ref >=60.0)
Glucose: 121 mg/dL — ABNORMAL HIGH (ref 70–100)
Hemolysis Index: 5 {index}
Potassium: 4.2 meq/L (ref 3.5–5.3)
Sodium: 144 meq/L (ref 135–145)

## 2023-11-29 LAB — MAGNESIUM: Magnesium: 1.8 mg/dL (ref 1.6–2.6)
